# Patient Record
Sex: Male | Born: 1955 | Race: White | Hispanic: No | Marital: Married | Smoking: Former smoker
Health system: Southern US, Community
[De-identification: ages and names within clinical notes are randomized; demographics above are authoritative.]

## PROBLEM LIST (undated history)

## (undated) DIAGNOSIS — I10 Essential (primary) hypertension: Secondary | ICD-10-CM

---

## 2015-06-14 HISTORY — PX: REPAIR THORACIC AORTA: SUR1211

## 2015-07-10 ENCOUNTER — Observation Stay
Admission: AD | Admit: 2015-07-10 | Discharge: 2015-07-11 | DRG: 247 | Disposition: A | Discharge status: Home / Self Care | Payer: PRIVATE HEALTH INSURANCE | Source: Ambulatory Visit | Attending: Internal Medicine | Admitting: Internal Medicine

## 2015-07-10 ENCOUNTER — Encounter
Admission: AD | Disposition: A | Discharge status: Home / Self Care | Payer: Self-pay | Source: Ambulatory Visit | Attending: Internal Medicine

## 2015-07-10 ENCOUNTER — Encounter: Payer: Self-pay | Admitting: *Deleted

## 2015-07-10 DIAGNOSIS — E782 Mixed hyperlipidemia: Secondary | ICD-10-CM | POA: Insufficient documentation

## 2015-07-10 DIAGNOSIS — E7800 Pure hypercholesterolemia, unspecified: Secondary | ICD-10-CM | POA: Diagnosis not present

## 2015-07-10 DIAGNOSIS — I25110 Atherosclerotic heart disease of native coronary artery with unstable angina pectoris: Principal | ICD-10-CM | POA: Diagnosis present

## 2015-07-10 DIAGNOSIS — Z87891 Personal history of nicotine dependence: Secondary | ICD-10-CM

## 2015-07-10 DIAGNOSIS — I10 Essential (primary) hypertension: Secondary | ICD-10-CM | POA: Diagnosis not present

## 2015-07-10 DIAGNOSIS — I25119 Atherosclerotic heart disease of native coronary artery with unspecified angina pectoris: Secondary | ICD-10-CM | POA: Diagnosis present

## 2015-07-10 DIAGNOSIS — I200 Unstable angina: Secondary | ICD-10-CM

## 2015-07-10 HISTORY — PX: CARDIAC CATHETERIZATION: SHX172

## 2015-07-10 LAB — BASIC METABOLIC PANEL
ANION GAP: 9 (ref 5–15)
Anion gap: 7 (ref 5–15)
BUN: 13 mg/dL (ref 6–20)
BUN: 12 mg/dL (ref 6–20)
CALCIUM: 8.9 mg/dL (ref 8.9–10.3)
CHLORIDE: 104 mmol/L (ref 101–111)
CO2: 22 mmol/L (ref 22–32)
CO2: 22 mmol/L (ref 22–32)
Calcium: 8.4 mg/dL — ABNORMAL LOW (ref 8.9–10.3)
Chloride: 107 mmol/L (ref 101–111)
Creatinine, Ser: 0.96 mg/dL (ref 0.61–1.24)
Creatinine, Ser: 0.94 mg/dL (ref 0.61–1.24)
GFR calc Af Amer: >60 mL/min (ref >60)
GFR calc non Af Amer: >60 mL/min (ref >60)
GLUCOSE: 101 mg/dL — AB (ref 65–99)
Glucose, Bld: 101 mg/dL — ABNORMAL HIGH (ref 65–99)
POTASSIUM: 4.1 mmol/L (ref 3.5–5.1)
POTASSIUM: 4 mmol/L (ref 3.5–5.1)
SODIUM: 138 mmol/L (ref 135–145)
Sodium: 133 mmol/L — ABNORMAL LOW (ref 135–145)

## 2015-07-10 LAB — CBC
HCT: 37 % — ABNORMAL LOW (ref 40.0–52.0)
Hemoglobin: 12.1 g/dL — ABNORMAL LOW (ref 13.0–18.0)
MCH: 29.6 pg (ref 26.0–34.0)
MCHC: 32.6 g/dL (ref 32.0–36.0)
MCV: 90.7 fL (ref 80.0–100.0)
PLATELETS: 495 10*3/uL — AB (ref 150–440)
RBC: 4.08 MIL/uL — AB (ref 4.40–5.90)
RDW: 13 % (ref 11.5–14.5)
WBC: 11.4 10*3/uL — AB (ref 3.8–10.6)

## 2015-07-10 LAB — MRSA PCR SCREENING: MRSA by PCR: NEGATIVE

## 2015-07-10 LAB — TROPONIN I: Troponin I: 0.03 ng/mL (ref <0.031)

## 2015-07-10 SURGERY — LEFT HEART CATH AND CORONARY ANGIOGRAPHY
Anesthesia: Moderate Sedation

## 2015-07-10 MED ORDER — MIDAZOLAM HCL 2 MG/2ML IJ SOLN
INTRAMUSCULAR | Status: AC
  Filled 2015-07-10: qty 2

## 2015-07-10 MED ORDER — SODIUM CHLORIDE 0.9 % IV SOLN
INTRAVENOUS | Status: AC
  Administered 2015-07-10: 21:00:00 via INTRAVENOUS

## 2015-07-10 MED ORDER — SODIUM CHLORIDE 0.9 % IV SOLN
INTRAVENOUS | Status: DC
  Administered 2015-07-10: 16:00:00 via INTRAVENOUS

## 2015-07-10 MED ORDER — SODIUM CHLORIDE 0.9% FLUSH
3.0000 mL | Freq: Two times a day (BID) | INTRAVENOUS | Status: DC
  Administered 2015-07-10 – 2015-07-11 (×2): 3 mL via INTRAVENOUS

## 2015-07-10 MED ORDER — METOPROLOL TARTRATE 1 MG/ML IV SOLN
INTRAVENOUS | Status: DC | PRN
  Administered 2015-07-10: 5 mg via INTRAVENOUS

## 2015-07-10 MED ORDER — ONDANSETRON HCL 4 MG/2ML IJ SOLN: 4.0000 mg | Freq: Four times a day (QID) | INTRAMUSCULAR | Status: DC | PRN

## 2015-07-10 MED ORDER — ASPIRIN 81 MG PO CHEW
81.0000 mg | CHEWABLE_TABLET | Freq: Every day | ORAL | Status: DC
  Administered 2015-07-11: 81 mg via ORAL
  Filled 2015-07-10: qty 1

## 2015-07-10 MED ORDER — FENTANYL CITRATE (PF) 100 MCG/2ML IJ SOLN
INTRAMUSCULAR | Status: DC | PRN
  Administered 2015-07-10 (×2): 25 ug via INTRAVENOUS
  Administered 2015-07-10 (×2): 50 ug via INTRAVENOUS

## 2015-07-10 MED ORDER — SODIUM CHLORIDE 0.9% FLUSH: 3.0000 mL | Freq: Two times a day (BID) | INTRAVENOUS | Status: DC

## 2015-07-10 MED ORDER — ASPIRIN 81 MG PO CHEW
CHEWABLE_TABLET | ORAL | Status: AC
  Filled 2015-07-10: qty 4

## 2015-07-10 MED ORDER — ASPIRIN 81 MG PO CHEW
CHEWABLE_TABLET | ORAL | Status: DC | PRN
  Administered 2015-07-10: 324 mg via ORAL

## 2015-07-10 MED ORDER — NITROGLYCERIN 5 MG/ML IV SOLN
INTRAVENOUS | Status: AC
  Filled 2015-07-10: qty 10

## 2015-07-10 MED ORDER — METOPROLOL TARTRATE 1 MG/ML IV SOLN
INTRAVENOUS | Status: AC
  Filled 2015-07-10: qty 5

## 2015-07-10 MED ORDER — SODIUM CHLORIDE 0.9% FLUSH
3.0000 mL | INTRAVENOUS | Status: DC | PRN
  Administered 2015-07-10: 3 mL via INTRAVENOUS
  Filled 2015-07-10: qty 3

## 2015-07-10 MED ORDER — ENOXAPARIN SODIUM 40 MG/0.4ML ~~LOC~~ SOLN
40.0000 mg | SUBCUTANEOUS | Status: DC
  Filled 2015-07-10: qty 0.4

## 2015-07-10 MED ORDER — NITROGLYCERIN 0.4 MG SL SUBL
SUBLINGUAL_TABLET | SUBLINGUAL | Status: AC
  Filled 2015-07-10: qty 1

## 2015-07-10 MED ORDER — MIDAZOLAM HCL 2 MG/2ML IJ SOLN
INTRAMUSCULAR | Status: DC | PRN
  Administered 2015-07-10 (×3): 1 mg via INTRAVENOUS

## 2015-07-10 MED ORDER — TICAGRELOR 90 MG PO TABS
ORAL_TABLET | ORAL | Status: DC | PRN
  Administered 2015-07-10: 180 mg via ORAL

## 2015-07-10 MED ORDER — HEPARIN (PORCINE) IN NACL 2-0.9 UNIT/ML-% IJ SOLN
INTRAMUSCULAR | Status: AC
  Filled 2015-07-10: qty 500

## 2015-07-10 MED ORDER — IOHEXOL 300 MG/ML  SOLN
INTRAMUSCULAR | Status: DC | PRN
Start: 2015-07-10 — End: 2015-07-10
  Administered 2015-07-10: 195 mL via INTRA_ARTERIAL
  Administered 2015-07-10: 180 mL via INTRA_ARTERIAL

## 2015-07-10 MED ORDER — TICAGRELOR 90 MG PO TABS
90.0000 mg | ORAL_TABLET | Freq: Two times a day (BID) | ORAL | Status: DC
  Administered 2015-07-10 – 2015-07-11 (×2): 90 mg via ORAL
  Filled 2015-07-10 (×2): qty 1

## 2015-07-10 MED ORDER — SODIUM CHLORIDE 0.9 % IV SOLN: 250.0000 mL | INTRAVENOUS | Status: DC | PRN

## 2015-07-10 MED ORDER — SODIUM CHLORIDE 0.9 % IV SOLN
250.0000 mg | INTRAVENOUS | Status: DC | PRN
  Administered 2015-07-10: 1.75 mg/kg/h via INTRAVENOUS

## 2015-07-10 MED ORDER — BIVALIRUDIN 250 MG IV SOLR
INTRAVENOUS | Status: AC
  Filled 2015-07-10: qty 250

## 2015-07-10 MED ORDER — FENTANYL CITRATE (PF) 100 MCG/2ML IJ SOLN
INTRAMUSCULAR | Status: AC
  Filled 2015-07-10: qty 2

## 2015-07-10 MED ORDER — ATORVASTATIN CALCIUM 20 MG PO TABS: 80.0000 mg | ORAL_TABLET | Freq: Every day | ORAL | Status: DC

## 2015-07-10 MED ORDER — METOPROLOL TARTRATE 25 MG PO TABS
25.0000 mg | ORAL_TABLET | Freq: Two times a day (BID) | ORAL | Status: DC
  Administered 2015-07-10 – 2015-07-11 (×2): 25 mg via ORAL
  Filled 2015-07-10 (×2): qty 1

## 2015-07-10 MED ORDER — ACETAMINOPHEN 325 MG PO TABS
650.0000 mg | ORAL_TABLET | ORAL | Status: DC | PRN
  Administered 2015-07-10: 650 mg via ORAL
  Filled 2015-07-10: qty 2

## 2015-07-10 MED ORDER — LISINOPRIL 5 MG PO TABS
5.0000 mg | ORAL_TABLET | Freq: Every day | ORAL | Status: DC
  Administered 2015-07-10 – 2015-07-11 (×2): 5 mg via ORAL
  Filled 2015-07-10 (×2): qty 1

## 2015-07-10 MED ORDER — BIVALIRUDIN BOLUS VIA INFUSION - CUPID
INTRAVENOUS | Status: DC | PRN
  Administered 2015-07-10: 60.9 mg via INTRAVENOUS

## 2015-07-10 MED ORDER — SODIUM CHLORIDE 0.9% FLUSH: 3.0000 mL | INTRAVENOUS | Status: DC | PRN

## 2015-07-10 MED ORDER — TICAGRELOR 90 MG PO TABS
ORAL_TABLET | ORAL | Status: AC
  Filled 2015-07-10: qty 2

## 2015-07-10 MED ORDER — NITROGLYCERIN 0.4 MG SL SUBL
SUBLINGUAL_TABLET | SUBLINGUAL | Status: DC | PRN
  Administered 2015-07-10: .4 mg via SUBLINGUAL

## 2015-07-10 SURGICAL SUPPLY — 23 items
BALLN TREK RX 2.5X12 (BALLOONS) ×6
BALLN ~~LOC~~ TREK RX 3.0X12 (BALLOONS) ×3
BALLN ~~LOC~~ TREK RX 4.0X12 (BALLOONS) ×3
BALLOON TREK RX 2.5X12 (BALLOONS) ×2 IMPLANT
BALLOON ~~LOC~~ TREK RX 3.0X12 (BALLOONS) ×1 IMPLANT
BALLOON ~~LOC~~ TREK RX 4.0X12 (BALLOONS) ×1 IMPLANT
CATH INFINITI 5FR ANG PIGTAIL (CATHETERS) ×3 IMPLANT
CATH INFINITI 5FR JL4 (CATHETERS) ×3 IMPLANT
CATH INFINITI JR4 5F (CATHETERS) ×3 IMPLANT
CATH VISTA GUIDE 6FR JL3.5 (CATHETERS) ×3 IMPLANT
CATH VISTA GUIDE 6FR XB3.5 (CATHETERS) ×3 IMPLANT
DEVICE CLOSURE MYNXGRIP 6/7F (Vascular Products) ×3 IMPLANT
DEVICE INFLAT 30 PLUS (MISCELLANEOUS) ×3 IMPLANT
KIT MANI 3VAL PERCEP (MISCELLANEOUS) ×3 IMPLANT
NEEDLE PERC 18GX7CM (NEEDLE) ×3 IMPLANT
PACK CARDIAC CATH (CUSTOM PROCEDURE TRAY) ×3 IMPLANT
SHEATH PINNACLE 5F 10CM (SHEATH) ×3 IMPLANT
SHEATH PINNACLE 6F 10CM (SHEATH) ×3 IMPLANT
STENT XIENCE ALPINE RX 2.75X15 (Permanent Stent) ×3 IMPLANT
STENT XIENCE ALPINE RX 4.0X15 (Permanent Stent) ×3 IMPLANT
WIRE EMERALD 3MM-J .035X150CM (WIRE) ×6 IMPLANT
WIRE INTUITION PROPEL ST 180CM (WIRE) ×3 IMPLANT
WIRE RUNTHROUGH .014X180CM (WIRE) ×3 IMPLANT

## 2015-07-10 NOTE — Progress Notes (Addendum)
eLink Physician-Brief Progress Note Patient Name: Christian Brennan DOB: 12-Feb-1956 MRN: [id number]   Date of Service  07/10/2015  HPI/Events of Note  60 yo male. No H&P or Cardiac Cath note at this time. Hx from bedside nurse. Admitted for chest pain >> cath lab >> stents to LAD and Circ. Current medical regimen includes ASA, Lipitor, Lovenox Barney, Lisinopril, Lopressor and Brilinta. Management per Cardiology.   eICU Interventions  Continue present management.      Intervention Category Major Interventions: Other: Evaluation Type: New Patient Evaluation  Lenell Antu 07/10/2015, 8:45 PM

## 2015-07-10 NOTE — Progress Notes (Signed)
Report to [facility] by [facility] lab.  Check right groin for bleeding or hematoma.  Patient will be on bedrest for 2 hours post sheath pull---out of bed at 19:30.  Bilateral pulses are 2's PT's.

## 2015-07-11 ENCOUNTER — Encounter: Payer: Self-pay | Admitting: *Deleted

## 2015-07-11 DIAGNOSIS — I25110 Atherosclerotic heart disease of native coronary artery with unstable angina pectoris: Secondary | ICD-10-CM | POA: Diagnosis not present

## 2015-07-11 LAB — CBC
HEMATOCRIT: 33.4 % — AB (ref 40.0–52.0)
HEMOGLOBIN: 11.3 g/dL — AB (ref 13.0–18.0)
MCH: 30 pg (ref 26.0–34.0)
MCHC: 33.9 g/dL (ref 32.0–36.0)
MCV: 88.4 fL (ref 80.0–100.0)
Platelets: 449 10*3/uL — ABNORMAL HIGH (ref 150–440)
RBC: 3.78 MIL/uL — ABNORMAL LOW (ref 4.40–5.90)
RDW: 13 % (ref 11.5–14.5)
WBC: 12.2 10*3/uL — ABNORMAL HIGH (ref 3.8–10.6)

## 2015-07-11 LAB — BASIC METABOLIC PANEL
ANION GAP: 13 (ref 5–15)
BUN: 14 mg/dL (ref 6–20)
CALCIUM: 8.7 mg/dL — AB (ref 8.9–10.3)
CHLORIDE: 106 mmol/L (ref 101–111)
CO2: 19 mmol/L — AB (ref 22–32)
Creatinine, Ser: 0.81 mg/dL (ref 0.61–1.24)
GFR calc Af Amer: >60 mL/min (ref >60)
GFR calc non Af Amer: >60 mL/min (ref >60)
GLUCOSE: 105 mg/dL — AB (ref 65–99)
POTASSIUM: 3.6 mmol/L (ref 3.5–5.1)
Sodium: 138 mmol/L (ref 135–145)

## 2015-07-11 MED ORDER — METOPROLOL TARTRATE 25 MG PO TABS
25.0000 mg | ORAL_TABLET | Freq: Once | ORAL | Status: AC
  Administered 2015-07-11: 25 mg via ORAL
  Filled 2015-07-11: qty 1

## 2015-07-11 MED ORDER — LISINOPRIL 5 MG PO TABS: 5.0000 mg | ORAL_TABLET | Freq: Every day | ORAL | Status: DC

## 2015-07-11 MED ORDER — ATORVASTATIN CALCIUM 80 MG PO TABS: 80.0000 mg | ORAL_TABLET | Freq: Every day | ORAL | Status: AC

## 2015-07-11 MED ORDER — TICAGRELOR 90 MG PO TABS: 90.0000 mg | ORAL_TABLET | Freq: Two times a day (BID) | ORAL | Status: DC

## 2015-07-11 MED ORDER — ASPIRIN 81 MG PO CHEW: 81.0000 mg | CHEWABLE_TABLET | Freq: Every day | ORAL | Status: DC

## 2015-07-11 MED ORDER — METOPROLOL TARTRATE 50 MG PO TABS: 50.0000 mg | ORAL_TABLET | Freq: Two times a day (BID) | ORAL | Status: DC

## 2015-07-11 NOTE — Progress Notes (Signed)
Ambulated with patient in the hallway. He was able to complete one lap around the entire ICU until he asked to lay back down. Patient denied SOB, dizziness, or chest pain. According to monitor, the patient has begun going in and out of a-fib since 0840 this morning. Paged Dr. Gwen Pounds and order to obtain EKG was given. Order placed in epic.

## 2015-07-11 NOTE — Progress Notes (Signed)
Patient is to be discharged today. Patient is in no acute distress at this time, and assessment is unchanged from this morning. Patient's IV is out, discharge paperwork has been discussed with patient/family and there are no questions or concerns at this time. Patient will be accompanied downstairs by staff and family via wheelchair.   

## 2015-07-11 NOTE — Discharge Summary (Signed)
[facility] Cardiology Discharge Summary  Patient ID: Christian Brennan MRN: [id number] DOB/AGE: 02-23-1956 60 y.o.  Admit date: 07/10/2015 Discharge date: 07/11/2015  Primary Discharge Diagnosis: Coronary artery disease with angina I25.119 Secondary Discharge Diagnosis high blood pressure, high cholesterol and smoking  Significant Diagnostic Studies: Cardiac cath with left ventricular angiogram and selective coronary injection as well as PCI and stent placement of left anterior descending. And left circumflex artery  Hospital Course: The patient was admitted to specials for cardiac cath with selective coronary angiogram after full consent, risk and benefits explained, and time out called with all approprate details voiced and discussed. The patient has had progressive canadian class 4 angina with ischemic chest pain and or anginal equivalent with coronary artery risk factors including high blood pressure, high cholesterol and smoking. The procedure was performed without complication and it revealed abnormal left ventricular function with ejection fraction of 35%.  It was found that the patient had severe 2 vessel coronary atherosclerosis with significant left anterior descending artery and left circumflex artery stenosis requiring further intervention. Therefore, the patient had a PCI and drug eluding stent placed without complication. The patient has been ambulating without further significant symptoms and has reached his maximal hospital benefit and will be discharged to home in good condition.  Cardiac rehabilitation has been discussed and recommended. Medication management of cardiovascular risk factors will be given post discharge and modified as an outpatient.  The patient had minimal sinus arrhythmia with ambulation but no evidence of symptoms walking around the unit and adjustments of medications were made listed below Discharge Exam: Blood pressure 126/90, pulse 97, temperature 98.7  F (37.1 C), temperature source Oral, resp. rate 18, height  (1.6 m), weight 183 lb 6.8 oz (83.2 kg), SpO2 97 %.  Constitutional: Alet oriented to person, place, and time. No distress.  HENT: No nasal discharge.  Head: Normocephalic and atraumatic.  Eyes: Pupils are equal and round. No discharge.  Neck: Normal range of motion. Neck supple. No JVD present. No thyromegaly present.  Cardiovascular: Normal rate, regular rhythm, normal S1 S2, no gallop, no friction rub. No murmur Pulmonary/Chest: Effort normal, No stridor. No respiratory distress. no wheezes.  no rales.    Abdominal: Soft. Bowel sounds are normal.  no distension.  no tenderness. There is no rebound and no guarding.  Musculoskeletal: No edema, no cyanosis, normal pulses, no bleeding, Normal range of motion. no tenderness.  Neurological:  alert and oriented to person, place, and time. Coordination normal.  Skin: Skin is warm and dry. No rash noted. No erythema. No pallor.  Psychiatric:  normal mood and affect. behavior is normal.    Labs:   Lab Results  Component Value Date   WBC 12.2* 07/11/2015   HGB 11.3* 07/11/2015   HCT 33.4* 07/11/2015   MCV 88.4 07/11/2015   PLT 449* 07/11/2015    Recent Labs Lab 07/11/15 0438  NA 138  K 3.6  CL 106  CO2 19*  BUN 14  CREATININE 0.81  CALCIUM 8.7*  GLUCOSE 105*    EKG: NSR without evidence of new changes  FOLLOW UP IN ONE TO TWO WEEKS Discharge Instructions    AMB Referral to Cardiac Rehabilitation - Phase II    Complete by:  As directed   Diagnosis:  PCI            Medication List    STOP taking these medications        ibuprofen 200 MG tablet  Commonly known  as:  ADVIL,MOTRIN      TAKE these medications        aspirin 81 MG chewable tablet  Chew 1 tablet (81 mg total) by mouth daily.     atorvastatin 80 MG tablet  Commonly known as:  LIPITOR  Take 1 tablet (80 mg total) by mouth daily at 6 PM.     famotidine 20 MG tablet  Commonly known as:   PEPCID  Take 20 mg by mouth 2 (two) times daily. Reported on 07/11/2015     lisinopril 5 MG tablet  Commonly known as:  PRINIVIL,ZESTRIL  Take 1 tablet (5 mg total) by mouth daily.     metoprolol 50 MG tablet  Commonly known as:  LOPRESSOR  Take 1 tablet (50 mg total) by mouth 2 (two) times daily.     ticagrelor 90 MG Tabs tablet  Commonly known as:  BRILINTA  Take 1 tablet (90 mg total) by mouth 2 (two) times daily.           Follow-up Information    Follow up with Lamar Blinks, MD. Schedule an appointment as soon as possible for a visit in 7 days.   Specialty:  Internal Medicine   Why:  cath follow up   Contact information:   [address] [facility] [place] [address] [phone]       THE PATIENT  SHALL BRING ALL MEDICATIONS TO FOLLOW UP APPOINTMENT  Signed:  Lamar Blinks MD, [facility] 07/11/2015, 10:07 AM

## 2015-07-13 ENCOUNTER — Encounter: Payer: Self-pay | Admitting: Internal Medicine

## 2015-07-17 ENCOUNTER — Other Ambulatory Visit: Payer: Self-pay | Admitting: Internal Medicine

## 2015-07-17 DIAGNOSIS — K8689 Other specified diseases of pancreas: Secondary | ICD-10-CM

## 2015-07-17 DIAGNOSIS — I2510 Atherosclerotic heart disease of native coronary artery without angina pectoris: Secondary | ICD-10-CM | POA: Insufficient documentation

## 2015-07-20 ENCOUNTER — Ambulatory Visit
Admission: RE | Admit: 2015-07-20 | Discharge: 2015-07-20 | Disposition: A | Discharge status: Home / Self Care | Payer: PRIVATE HEALTH INSURANCE | Source: Ambulatory Visit | Attending: Internal Medicine | Admitting: Internal Medicine

## 2015-07-20 DIAGNOSIS — R1084 Generalized abdominal pain: Secondary | ICD-10-CM | POA: Insufficient documentation

## 2015-07-20 DIAGNOSIS — K8689 Other specified diseases of pancreas: Secondary | ICD-10-CM

## 2015-08-05 ENCOUNTER — Other Ambulatory Visit: Payer: Self-pay | Admitting: Nurse Practitioner

## 2015-08-05 DIAGNOSIS — R1013 Epigastric pain: Secondary | ICD-10-CM

## 2015-08-05 DIAGNOSIS — R142 Eructation: Secondary | ICD-10-CM

## 2015-08-05 DIAGNOSIS — R1084 Generalized abdominal pain: Secondary | ICD-10-CM

## 2015-08-11 ENCOUNTER — Ambulatory Visit
Admission: RE | Admit: 2015-08-11 | Discharge: 2015-08-11 | Disposition: A | Discharge status: Home / Self Care | Payer: No Typology Code available for payment source | Source: Ambulatory Visit | Attending: Nurse Practitioner | Admitting: Nurse Practitioner

## 2015-08-11 ENCOUNTER — Other Ambulatory Visit: Payer: Self-pay | Admitting: Nurse Practitioner

## 2015-08-11 ENCOUNTER — Emergency Department: Payer: No Typology Code available for payment source | Admitting: Anesthesiology

## 2015-08-11 ENCOUNTER — Encounter: Payer: Self-pay | Admitting: Anesthesiology

## 2015-08-11 ENCOUNTER — Inpatient Hospital Stay
Admission: EM | Admit: 2015-08-11 | Discharge: 2015-08-14 | DRG: 219 | Disposition: A | Discharge status: Home / Self Care | Payer: No Typology Code available for payment source | Attending: Vascular Surgery | Admitting: Vascular Surgery

## 2015-08-11 ENCOUNTER — Encounter
Admission: EM | Disposition: A | Discharge status: Home / Self Care | Payer: Self-pay | Source: Home / Self Care | Attending: Vascular Surgery

## 2015-08-11 DIAGNOSIS — I493 Ventricular premature depolarization: Secondary | ICD-10-CM | POA: Diagnosis present

## 2015-08-11 DIAGNOSIS — T82897A Other specified complication of cardiac prosthetic devices, implants and grafts, initial encounter: Secondary | ICD-10-CM | POA: Diagnosis present

## 2015-08-11 DIAGNOSIS — I711 Thoracic aortic aneurysm, ruptured, unspecified: Secondary | ICD-10-CM | POA: Diagnosis present

## 2015-08-11 DIAGNOSIS — I2510 Atherosclerotic heart disease of native coronary artery without angina pectoris: Secondary | ICD-10-CM | POA: Diagnosis present

## 2015-08-11 DIAGNOSIS — I724 Aneurysm of artery of lower extremity: Secondary | ICD-10-CM | POA: Diagnosis present

## 2015-08-11 DIAGNOSIS — R1084 Generalized abdominal pain: Secondary | ICD-10-CM

## 2015-08-11 DIAGNOSIS — Z7982 Long term (current) use of aspirin: Secondary | ICD-10-CM | POA: Diagnosis not present

## 2015-08-11 DIAGNOSIS — R9389 Abnormal findings on diagnostic imaging of other specified body structures: Secondary | ICD-10-CM

## 2015-08-11 DIAGNOSIS — Y838 Other surgical procedures as the cause of abnormal reaction of the patient, or of later complication, without mention of misadventure at the time of the procedure: Secondary | ICD-10-CM | POA: Diagnosis present

## 2015-08-11 DIAGNOSIS — E785 Hyperlipidemia, unspecified: Secondary | ICD-10-CM | POA: Diagnosis present

## 2015-08-11 DIAGNOSIS — R1013 Epigastric pain: Secondary | ICD-10-CM

## 2015-08-11 DIAGNOSIS — R142 Eructation: Secondary | ICD-10-CM

## 2015-08-11 DIAGNOSIS — I10 Essential (primary) hypertension: Secondary | ICD-10-CM | POA: Diagnosis present

## 2015-08-11 DIAGNOSIS — K219 Gastro-esophageal reflux disease without esophagitis: Secondary | ICD-10-CM | POA: Diagnosis present

## 2015-08-11 DIAGNOSIS — Z79899 Other long term (current) drug therapy: Secondary | ICD-10-CM | POA: Diagnosis not present

## 2015-08-11 DIAGNOSIS — Z87891 Personal history of nicotine dependence: Secondary | ICD-10-CM

## 2015-08-11 DIAGNOSIS — I4891 Unspecified atrial fibrillation: Secondary | ICD-10-CM | POA: Diagnosis present

## 2015-08-11 DIAGNOSIS — I209 Angina pectoris, unspecified: Secondary | ICD-10-CM

## 2015-08-11 DIAGNOSIS — E43 Unspecified severe protein-calorie malnutrition: Secondary | ICD-10-CM | POA: Insufficient documentation

## 2015-08-11 HISTORY — DX: Essential (primary) hypertension: I10

## 2015-08-11 LAB — CBC
HCT: 29.5 % — ABNORMAL LOW (ref 40.0–52.0)
Hemoglobin: 9.3 g/dL — ABNORMAL LOW (ref 13.0–18.0)
MCH: 27.9 pg (ref 26.0–34.0)
MCHC: 31.7 g/dL — ABNORMAL LOW (ref 32.0–36.0)
MCV: 88 fL (ref 80.0–100.0)
PLATELETS: 495 10*3/uL — AB (ref 150–440)
RBC: 3.35 MIL/uL — AB (ref 4.40–5.90)
RDW: 14.1 % (ref 11.5–14.5)
WBC: 12.8 10*3/uL — AB (ref 3.8–10.6)

## 2015-08-11 LAB — CBC WITH DIFFERENTIAL/PLATELET
BASOS ABS: 0 10*3/uL (ref 0–0.1)
BASOS PCT: 0 %
EOS PCT: 1 %
Eosinophils Absolute: 0.1 10*3/uL (ref 0–0.7)
HCT: 33.2 % — ABNORMAL LOW (ref 40.0–52.0)
Hemoglobin: 10.9 g/dL — ABNORMAL LOW (ref 13.0–18.0)
LYMPHS PCT: 9 %
Lymphs Abs: 1.1 10*3/uL (ref 1.0–3.6)
MCH: 28.8 pg (ref 26.0–34.0)
MCHC: 32.8 g/dL (ref 32.0–36.0)
MCV: 87.7 fL (ref 80.0–100.0)
MONO ABS: 1.4 10*3/uL — AB (ref 0.2–1.0)
MONOS PCT: 11 %
Neutro Abs: 10.2 10*3/uL — ABNORMAL HIGH (ref 1.4–6.5)
Neutrophils Relative %: 79 %
PLATELETS: 597 10*3/uL — AB (ref 150–440)
RBC: 3.78 MIL/uL — ABNORMAL LOW (ref 4.40–5.90)
RDW: 14.3 % (ref 11.5–14.5)
WBC: 12.9 10*3/uL — ABNORMAL HIGH (ref 3.8–10.6)

## 2015-08-11 LAB — COMPREHENSIVE METABOLIC PANEL
ALK PHOS: 354 U/L — AB (ref 38–126)
ALT: 40 U/L (ref 17–63)
AST: 28 U/L (ref 15–41)
Albumin: 2.9 g/dL — ABNORMAL LOW (ref 3.5–5.0)
Anion gap: 9 (ref 5–15)
BUN: 11 mg/dL (ref 6–20)
CALCIUM: 9 mg/dL (ref 8.9–10.3)
CO2: 24 mmol/L (ref 22–32)
CREATININE: 0.93 mg/dL (ref 0.61–1.24)
Chloride: 102 mmol/L (ref 101–111)
Glucose, Bld: 110 mg/dL — ABNORMAL HIGH (ref 65–99)
Potassium: 3 mmol/L — ABNORMAL LOW (ref 3.5–5.1)
Sodium: 135 mmol/L (ref 135–145)
Total Bilirubin: 0.6 mg/dL (ref 0.3–1.2)
Total Protein: 8.2 g/dL — ABNORMAL HIGH (ref 6.5–8.1)

## 2015-08-11 LAB — POTASSIUM: POTASSIUM: 3.6 mmol/L (ref 3.5–5.1)

## 2015-08-11 LAB — PROTIME-INR
INR: 1.17
Prothrombin Time: 15.1 seconds — ABNORMAL HIGH (ref 11.4–15.0)

## 2015-08-11 LAB — TROPONIN I
TROPONIN I: 0.05 ng/mL — AB (ref <0.031)
TROPONIN I: 0.03 ng/mL (ref <0.031)

## 2015-08-11 LAB — MRSA PCR SCREENING: MRSA BY PCR: NEGATIVE

## 2015-08-11 LAB — LIPASE, BLOOD: LIPASE: 28 U/L (ref 11–51)

## 2015-08-11 LAB — APTT: APTT: 41 s — AB (ref 24–36)

## 2015-08-11 LAB — MAGNESIUM: MAGNESIUM: 1.7 mg/dL (ref 1.7–2.4)

## 2015-08-11 LAB — ABO/RH: ABO/RH(D): A POS

## 2015-08-11 SURGERY — ENDOVASCULAR AORTIC REPAIR
Anesthesia: General | Wound class: Clean

## 2015-08-11 SURGERY — Surgical Case
Anesthesia: *Unknown

## 2015-08-11 MED ORDER — MIDAZOLAM HCL 2 MG/2ML IJ SOLN
INTRAMUSCULAR | Status: DC | PRN
  Administered 2015-08-11: 2 mg via INTRAVENOUS

## 2015-08-11 MED ORDER — DOPAMINE-DEXTROSE 3.2-5 MG/ML-% IV SOLN: 3.0000 ug/kg/min | INTRAVENOUS | Status: DC

## 2015-08-11 MED ORDER — MORPHINE SULFATE (PF) 2 MG/ML IV SOLN
2.0000 mg | INTRAVENOUS | Status: DC | PRN
  Administered 2015-08-11: 2 mg via INTRAVENOUS
  Administered 2015-08-12: 4 mg via INTRAVENOUS
  Administered 2015-08-12 (×2): 2 mg via INTRAVENOUS
  Filled 2015-08-11 (×3): qty 1
  Filled 2015-08-11: qty 2

## 2015-08-11 MED ORDER — LABETALOL HCL 5 MG/ML IV SOLN
10.0000 mg | INTRAVENOUS | Status: DC | PRN
Start: 2015-08-11 — End: 2015-08-14
  Filled 2015-08-11: qty 4

## 2015-08-11 MED ORDER — PANTOPRAZOLE SODIUM 40 MG PO TBEC
40.0000 mg | DELAYED_RELEASE_TABLET | Freq: Two times a day (BID) | ORAL | Status: DC
  Administered 2015-08-12: 40 mg via ORAL
  Filled 2015-08-11: qty 1

## 2015-08-11 MED ORDER — SODIUM CHLORIDE 0.9 % IV SOLN
INTRAVENOUS | Status: DC | PRN
  Administered 2015-08-11 (×2): via INTRAVENOUS

## 2015-08-11 MED ORDER — LISINOPRIL 5 MG PO TABS
5.0000 mg | ORAL_TABLET | Freq: Every day | ORAL | Status: DC
  Administered 2015-08-12 – 2015-08-14 (×3): 5 mg via ORAL
  Filled 2015-08-11: qty 1
  Filled 2015-08-11: qty 2
  Filled 2015-08-11: qty 1

## 2015-08-11 MED ORDER — HEPARIN SODIUM (PORCINE) 1000 UNIT/ML IJ SOLN
INTRAMUSCULAR | Status: DC | PRN
  Administered 2015-08-11: 6000 [IU] via INTRAVENOUS

## 2015-08-11 MED ORDER — ASPIRIN EC 81 MG PO TBEC
81.0000 mg | DELAYED_RELEASE_TABLET | Freq: Every day | ORAL | Status: DC
  Administered 2015-08-12 – 2015-08-14 (×3): 81 mg via ORAL
  Filled 2015-08-11 (×3): qty 1

## 2015-08-11 MED ORDER — ACETAMINOPHEN 325 MG PO TABS: 325.0000 mg | ORAL_TABLET | ORAL | Status: DC | PRN

## 2015-08-11 MED ORDER — ENOXAPARIN SODIUM 40 MG/0.4ML ~~LOC~~ SOLN
40.0000 mg | SUBCUTANEOUS | Status: DC
  Filled 2015-08-11 (×3): qty 0.4

## 2015-08-11 MED ORDER — ACETAMINOPHEN 325 MG RE SUPP: 325.0000 mg | RECTAL | Status: DC | PRN

## 2015-08-11 MED ORDER — POLYETHYLENE GLYCOL 3350 17 G PO PACK: 17.0000 g | PACK | Freq: Every day | ORAL | Status: DC | PRN

## 2015-08-11 MED ORDER — SUCCINYLCHOLINE CHLORIDE 20 MG/ML IJ SOLN
INTRAMUSCULAR | Status: DC | PRN
  Administered 2015-08-11: 100 mg via INTRAVENOUS

## 2015-08-11 MED ORDER — GLYCOPYRROLATE 0.2 MG/ML IJ SOLN
INTRAMUSCULAR | Status: DC | PRN
  Administered 2015-08-11: 0.4 mg via INTRAVENOUS
  Administered 2015-08-11: 0.2 mg via INTRAVENOUS

## 2015-08-11 MED ORDER — SORBITOL 70 % SOLN
30.0000 mL | Freq: Every day | Status: DC | PRN
  Filled 2015-08-11: qty 30

## 2015-08-11 MED ORDER — SODIUM CHLORIDE 0.9 % IV SOLN
INTRAVENOUS | Status: DC
  Administered 2015-08-11 – 2015-08-12 (×2): via INTRAVENOUS

## 2015-08-11 MED ORDER — PROPOFOL 10 MG/ML IV BOLUS
INTRAVENOUS | Status: DC | PRN
  Administered 2015-08-11: 20 mg via INTRAVENOUS
  Administered 2015-08-11: 30 mg via INTRAVENOUS
  Administered 2015-08-11: 150 mg via INTRAVENOUS

## 2015-08-11 MED ORDER — DEXMEDETOMIDINE HCL 200 MCG/2ML IV SOLN
INTRAVENOUS | Status: DC | PRN
  Administered 2015-08-11 (×2): 4 ug via INTRAVENOUS
  Administered 2015-08-11: 8 ug via INTRAVENOUS

## 2015-08-11 MED ORDER — ROCURONIUM BROMIDE 100 MG/10ML IV SOLN
INTRAVENOUS | Status: DC | PRN
  Administered 2015-08-11: 10 mg via INTRAVENOUS
  Administered 2015-08-11: 30 mg via INTRAVENOUS
  Administered 2015-08-11: 20 mg via INTRAVENOUS

## 2015-08-11 MED ORDER — FLEET ENEMA 7-19 GM/118ML RE ENEM: 1.0000 | ENEMA | Freq: Once | RECTAL | Status: DC | PRN

## 2015-08-11 MED ORDER — SUCRALFATE 1 G PO TABS
1.0000 g | ORAL_TABLET | Freq: Three times a day (TID) | ORAL | Status: DC
  Administered 2015-08-11 – 2015-08-14 (×10): 1 g via ORAL
  Filled 2015-08-11 (×11): qty 1

## 2015-08-11 MED ORDER — SODIUM CHLORIDE 0.9 % IV SOLN: 500.0000 mL | Freq: Once | INTRAVENOUS | Status: DC | PRN

## 2015-08-11 MED ORDER — ATORVASTATIN CALCIUM 20 MG PO TABS
80.0000 mg | ORAL_TABLET | Freq: Every day | ORAL | Status: DC
  Administered 2015-08-12 – 2015-08-13 (×2): 80 mg via ORAL
  Filled 2015-08-11: qty 4
  Filled 2015-08-11: qty 1
  Filled 2015-08-11: qty 4

## 2015-08-11 MED ORDER — SUGAMMADEX SODIUM 200 MG/2ML IV SOLN
INTRAVENOUS | Status: DC | PRN
  Administered 2015-08-11: 200 mg via INTRAVENOUS

## 2015-08-11 MED ORDER — NITROGLYCERIN IN D5W 200-5 MCG/ML-% IV SOLN: 5.0000 ug/min | INTRAVENOUS | Status: DC

## 2015-08-11 MED ORDER — SODIUM CHLORIDE 0.9 % IV BOLUS (SEPSIS)
1000.0000 mL | Freq: Once | INTRAVENOUS | Status: AC
  Administered 2015-08-11: 1000 mL via INTRAVENOUS

## 2015-08-11 MED ORDER — ALUM & MAG HYDROXIDE-SIMETH 200-200-20 MG/5ML PO SUSP: 15.0000 mL | ORAL | Status: DC | PRN

## 2015-08-11 MED ORDER — MAGNESIUM SULFATE 2 GM/50ML IV SOLN
2.0000 g | Freq: Every day | INTRAVENOUS | Status: DC | PRN
  Filled 2015-08-11: qty 50

## 2015-08-11 MED ORDER — POTASSIUM CHLORIDE CRYS ER 20 MEQ PO TBCR: 20.0000 meq | EXTENDED_RELEASE_TABLET | Freq: Every day | ORAL | Status: DC | PRN

## 2015-08-11 MED ORDER — ONDANSETRON HCL 4 MG/2ML IJ SOLN
4.0000 mg | Freq: Four times a day (QID) | INTRAMUSCULAR | Status: DC | PRN
  Administered 2015-08-12 – 2015-08-13 (×2): 4 mg via INTRAVENOUS
  Filled 2015-08-11 (×2): qty 2

## 2015-08-11 MED ORDER — CEFAZOLIN SODIUM 1 G IJ SOLR
INTRAMUSCULAR | Status: DC | PRN
  Administered 2015-08-11: 1 g via INTRAMUSCULAR

## 2015-08-11 MED ORDER — TICAGRELOR 90 MG PO TABS
90.0000 mg | ORAL_TABLET | Freq: Two times a day (BID) | ORAL | Status: DC
  Administered 2015-08-11 – 2015-08-14 (×6): 90 mg via ORAL
  Filled 2015-08-11 (×8): qty 1

## 2015-08-11 MED ORDER — PHENYLEPHRINE HCL 10 MG/ML IJ SOLN
INTRAMUSCULAR | Status: DC | PRN
  Administered 2015-08-11 (×3): 80 ug via INTRAVENOUS

## 2015-08-11 MED ORDER — IOHEXOL 350 MG/ML SOLN
100.0000 mL | Freq: Once | INTRAVENOUS | Status: AC | PRN
  Administered 2015-08-11: 100 mL via INTRAVENOUS

## 2015-08-11 MED ORDER — NITROGLYCERIN 0.4 MG SL SUBL
0.4000 mg | SUBLINGUAL_TABLET | SUBLINGUAL | Status: DC | PRN
  Administered 2015-08-11 (×2): 0.4 mg via SUBLINGUAL
  Filled 2015-08-11 (×2): qty 1

## 2015-08-11 MED ORDER — OXYCODONE-ACETAMINOPHEN 5-325 MG PO TABS
1.0000 | ORAL_TABLET | ORAL | Status: DC | PRN
  Administered 2015-08-12 (×3): 1 via ORAL
  Administered 2015-08-13 – 2015-08-14 (×4): 2 via ORAL
  Filled 2015-08-11: qty 1
  Filled 2015-08-11 (×4): qty 2
  Filled 2015-08-11: qty 1
  Filled 2015-08-11: qty 2

## 2015-08-11 MED ORDER — METOPROLOL TARTRATE 50 MG PO TABS
50.0000 mg | ORAL_TABLET | Freq: Two times a day (BID) | ORAL | Status: DC
  Administered 2015-08-11 – 2015-08-14 (×5): 50 mg via ORAL
  Filled 2015-08-11 (×5): qty 1

## 2015-08-11 MED ORDER — FAMOTIDINE IN NACL 20-0.9 MG/50ML-% IV SOLN
20.0000 mg | Freq: Two times a day (BID) | INTRAVENOUS | Status: DC
  Administered 2015-08-11 – 2015-08-13 (×4): 20 mg via INTRAVENOUS
  Filled 2015-08-11 (×5): qty 50

## 2015-08-11 MED ORDER — GUAIFENESIN-DM 100-10 MG/5ML PO SYRP: 15.0000 mL | ORAL_SOLUTION | ORAL | Status: DC | PRN

## 2015-08-11 MED ORDER — METOPROLOL TARTRATE 1 MG/ML IV SOLN: 2.0000 mg | INTRAVENOUS | Status: DC | PRN

## 2015-08-11 MED ORDER — LIDOCAINE HCL (CARDIAC) 20 MG/ML IV SOLN
INTRAVENOUS | Status: DC | PRN
  Administered 2015-08-11: 50 mg via INTRAVENOUS

## 2015-08-11 MED ORDER — FENTANYL CITRATE (PF) 100 MCG/2ML IJ SOLN
INTRAMUSCULAR | Status: DC | PRN
  Administered 2015-08-11: 25 ug via INTRAVENOUS
  Administered 2015-08-11: 100 ug via INTRAVENOUS
  Administered 2015-08-11: 25 ug via INTRAVENOUS
  Administered 2015-08-11: 50 ug via INTRAVENOUS

## 2015-08-11 MED ORDER — PHENOL 1.4 % MT LIQD
1.0000 | OROMUCOSAL | Status: DC | PRN
  Filled 2015-08-11: qty 177

## 2015-08-11 MED ORDER — DEXTROSE 5 % IV SOLN
1.5000 g | Freq: Two times a day (BID) | INTRAVENOUS | Status: AC
  Administered 2015-08-11 – 2015-08-12 (×2): 1.5 g via INTRAVENOUS
  Filled 2015-08-11 (×2): qty 1.5

## 2015-08-11 MED ORDER — MORPHINE SULFATE (PF) 2 MG/ML IV SOLN
INTRAVENOUS | Status: AC
  Administered 2015-08-11: 2 mg via INTRAMUSCULAR
  Filled 2015-08-11: qty 1

## 2015-08-11 MED ORDER — DOCUSATE SODIUM 100 MG PO CAPS
100.0000 mg | ORAL_CAPSULE | Freq: Every day | ORAL | Status: DC
  Administered 2015-08-12 – 2015-08-14 (×3): 100 mg via ORAL
  Filled 2015-08-11 (×3): qty 1

## 2015-08-11 MED ORDER — NEOSTIGMINE METHYLSULFATE 10 MG/10ML IV SOLN
INTRAVENOUS | Status: DC | PRN
  Administered 2015-08-11: 3 mg via INTRAVENOUS
  Administered 2015-08-11: 1 mg via INTRAVENOUS

## 2015-08-11 MED ORDER — IOHEXOL 300 MG/ML  SOLN
100.0000 mL | Freq: Once | INTRAMUSCULAR | Status: AC | PRN
  Administered 2015-08-11: 100 mL via INTRAVENOUS

## 2015-08-11 MED ORDER — IOHEXOL 300 MG/ML  SOLN
INTRAMUSCULAR | Status: DC | PRN
  Administered 2015-08-11: 60 mL via INTRA_ARTERIAL

## 2015-08-11 MED ORDER — SODIUM CHLORIDE 0.9 % IV SOLN
INTRAVENOUS | Status: DC | PRN
  Administered 2015-08-11 (×2): via INTRAVENOUS

## 2015-08-11 MED ORDER — HYDRALAZINE HCL 20 MG/ML IJ SOLN: 5.0000 mg | INTRAMUSCULAR | Status: DC | PRN

## 2015-08-11 SURGICAL SUPPLY — 31 items
CATH BALLN TRILOBE 26-42 (BALLOONS) ×3 IMPLANT
CATH KA2 5FR 65CM (CATHETERS) ×3 IMPLANT
CATH SIZING 5F 100CM .035 PIG (CATHETERS) ×6 IMPLANT
DEVICE STARCLOSE SE CLOSURE (Vascular Products) ×3 IMPLANT
DEVICE TORQUE (MISCELLANEOUS) ×3 IMPLANT
DRYSEAL FLEXSHEATH 24FR 33CM (SHEATH) ×2
ENDOPROSTHESIS THORAC 31X31X15 (Endovascular Graft) ×1 IMPLANT
ENDOPROSTHESIS THORAC 37X37X20 (Endovascular Graft) ×1 IMPLANT
ENDOPROTH THORACIC 31X31X15 (Endovascular Graft) ×3 IMPLANT
ENDOPROTH THORACIC 37X37X20 (Endovascular Graft) ×3 IMPLANT
HEMOSTAT SURGICEL 2X3 (HEMOSTASIS) ×3 IMPLANT
NEEDLE ENTRY 21GA 7CM ECHOTIP (NEEDLE) ×3 IMPLANT
PACK ANGIOGRAPHY (CUSTOM PROCEDURE TRAY) ×3 IMPLANT
SET INTRO CAPELLA COAXIAL (SET/KITS/TRAYS/PACK) ×3 IMPLANT
SHEATH BRITE TIP 5FRX11 (SHEATH) ×3 IMPLANT
SHEATH BRITE TIP 6FRX11 (SHEATH) ×3 IMPLANT
SHEATH BRITE TIP 8FRX11 (SHEATH) ×3 IMPLANT
SHEATH DRYSEAL FLEX 24FR 33CM (SHEATH) ×1 IMPLANT
SPONGE XRAY 4X4 16PLY STRL (MISCELLANEOUS) ×12 IMPLANT
SUT PROLENE 5 0 RB 1 DA (SUTURE) ×12 IMPLANT
SUT PROLENE 5-0 (SUTURE) ×4
SUT PROLENE 5-0 BB 36X2 ARM (SUTURE) ×2
SUT SILK 2 0 (SUTURE) ×2
SUT SILK 2-0 18XBRD TIE 12 (SUTURE) ×1 IMPLANT
SUTURE PROLEN 5-0 BB 36X2 ARM (SUTURE) ×2 IMPLANT
SYR MEDRAD MARK V 150ML (SYRINGE) ×3 IMPLANT
TOWEL OR 17X26 4PK STRL BLUE (TOWEL DISPOSABLE) ×6 IMPLANT
TUBING CONTRAST HIGH PRESS 72 (TUBING) ×3 IMPLANT
WIRE G LUND 35X260X7 (WIRE) ×3 IMPLANT
WIRE J 3MM .035X145CM (WIRE) ×6 IMPLANT
WIRE MAGIC TORQUE 260C (WIRE) ×3 IMPLANT

## 2015-08-11 NOTE — ED Provider Notes (Signed)
[facility] Emergency Department Provider Note  ____________________________________________  Time seen: 5:00 PM  I have reviewed the triage vital signs and the nursing notes.   HISTORY  Chief Complaint Abdominal Pain  Level 5 caveat:  Portions of the history and physical were unable to be obtained due to the patient's acute illness, not in patient's best interest to obtain a thorough history.   HPI Christian Brennan is a 60 y.o. male who complains of epigastric abdominal pain and mid back pain moderate intensity, constant, throbbing and sharp. He reports this is been going on for the past 2 months but has worsened recently.  He had a heart catheterization with stent deployment 4 weeks ago by Dr. Gwen Pounds.He does have residual pain in the right femoral area since then.     Past Medical History  Diagnosis Date  . Hypertension      Patient Active Problem List   Diagnosis Date Noted  . Unstable angina (HCC) 07/10/2015     Past Surgical History  Procedure Laterality Date  . Cardiac catheterization N/A 07/10/2015    Procedure: Left Heart Cath and Coronary Angiography;  Surgeon: Lamar Blinks, MD;  Location: [facility] INVASIVE CV LAB;  Service: Cardiovascular;  Laterality: N/A;  . Cardiac catheterization N/A 07/10/2015    Procedure: Coronary Stent Intervention;  Surgeon: Lamar Blinks, MD;  Location: [facility] INVASIVE CV LAB;  Service: Cardiovascular;  Laterality: N/A;  . Cardiac catheterization N/A 07/10/2015    Procedure: Coronary Stent Intervention;  Surgeon: Iran Ouch, MD;  Location: [facility] INVASIVE CV LAB;  Service: Cardiovascular;  Laterality: N/A;     Current Outpatient Rx  Name  Route  Sig  Dispense  Refill  . aspirin EC 81 MG tablet   Oral   Take 81 mg by mouth daily.         Marland Kitchen atorvastatin (LIPITOR) 80 MG tablet   Oral   Take 1 tablet (80 mg total) by mouth daily at 6 PM.   90 tablet   4   . lisinopril (PRINIVIL,ZESTRIL) 5 MG  tablet   Oral   Take 1 tablet (5 mg total) by mouth daily.   90 tablet   4   . metoprolol (LOPRESSOR) 50 MG tablet   Oral   Take 1 tablet (50 mg total) by mouth 2 (two) times daily.   60 tablet   4   . ticagrelor (BRILINTA) 90 MG TABS tablet   Oral   Take 1 tablet (90 mg total) by mouth 2 (two) times daily.   60 tablet   4      Allergies Review of patient's allergies indicates no known allergies.   No family history on file.  Social History Social History  Substance Use Topics  . Smoking status: Former Smoker -- 2.00 packs/day    Types: Cigarettes    Quit date: 07/09/2013  . Smokeless tobacco: Not on file     Comment: current vapor smoker  . Alcohol Use: No    Review of Systems  Constitutional:   No fever or chills. No weight changes Cardiovascular:   Positive as above chest pain. Respiratory:   No dyspnea or cough. Gastrointestinal:   Positive as above upper abdominal pain radiating to the back.  No BRBPR or melena. Genitourinary:   Negative for dysuria or difficulty urinating. Musculoskeletal:  Right groin pain. Skin:   Negative for rash. No bruising Neurological:   No weakness or paresthesias. Psychiatric:  No anxiety or depression.  10-point ROS otherwise negative.  ____________________________________________   PHYSICAL EXAM:  VITAL SIGNS: ED Triage Vitals  Enc Vitals Group     BP 08/11/15 1701 141/61 mmHg     Pulse Rate 08/11/15 1701 85     Resp 08/11/15 1701 18     Temp 08/11/15 1701 98 F (36.7 C)     Temp Source 08/11/15 1701 Oral     SpO2 08/11/15 1701 100 %     Weight --      Height --      Head Cir --      Peak Flow --      Pain Score --      Pain Loc --      Pain Edu? --      Excl. in GC? --     Vital signs reviewed, nursing assessments reviewed.   Constitutional:   Alert and oriented. Well appearing and in no distress. Eyes:   No scleral icterus. No conjunctival pallor. PERRL. EOMI ENT   Head:   Normocephalic and  atraumatic.   Nose:   No congestion/rhinnorhea. No septal hematoma   Mouth/Throat:   MMM, no pharyngeal erythema. No peritonsillar mass.    Neck:   No stridor. No SubQ emphysema. No meningismus. Hematological/Lymphatic/Immunilogical:   No cervical lymphadenopathy. Cardiovascular:   RRR. Symmetric bilateral radial and DP pulses.  No murmurs. Right femoral artery access site tender and swollen. Respiratory:   Normal respiratory effort without tachypnea nor retractions. Breath sounds are clear and equal bilaterally. No wheezes/rales/rhonchi. Gastrointestinal:   Soft with epigastric tenderness. Non distended. There is no CVA tenderness.  No rebound, rigidity, or guarding. No abdominal wall ecchymosis. Genitourinary:   Normal. Musculoskeletal:   Nontender with normal range of motion in all extremities. No joint effusions.  No lower extremity tenderness.  No edema. Neurologic:   Normal speech and language.  CN 2-10 normal. Motor grossly intact. No gross focal neurologic deficits are appreciated.  Skin:    Skin is warm, dry and intact. No rash noted.  No petechiae, purpura, or bullae. Psychiatric:   Mood and affect are normal. 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 It is currently contained. 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  Critical care was necessary to treat or prevent imminent or life-threatening deterioration.  Critical care was time spent personally by me on the following activities: development of treatment plan with patient and/or surrogate as well as nursing, discussions with consultants, evaluation of patient's response to treatment, examination of patient, obtaining history from patient or surrogate, ordering and performing treatments and interventions, ordering and review of laboratory studies, ordering and review of radiographic studies, pulse oximetry and re-evaluation of patient's condition.  ____________________________________________   INITIAL IMPRESSION / ASSESSMENT AND PLAN / ED COURSE  Pertinent labs & imaging results that were available during my care of the patient were reviewed by  me and considered in my medical decision making (see chart for details).  Patient presents with upper abdominal pain and back pain, had outpatient CT angiogram which reveals a ruptured but contained thoracic aortic aneurysm. This is a surgical emergency. I discussed the case with vascular surgery Dr. Wyn Quaker immediately upon completing initial assessment, about 5:10 PM. Have ordered preoperative labs, crossmatched 4 units of red cells and FFP. We'll start IV saline at a low rate, increase in blood pressure starts to go down. Dr. Wyn Quaker reports he already has a team on the way to take this patient to the operating room.  Patient initially seemed somewhat hesitant to take his current condition very seriously, I did relay to him the imminent risk of sudden free rupture which would most certainly result in death.    ----------------------------------------- 6:00 PM on 08/11/2015 -----------------------------------------  Patient remained stable throughout his course in the emergency department. Dr. Wyn Quaker  was at the bedside discussing management with the patient within 5-10 minutes of my phone call to him. Patient taken OR at about 5:50 PM in good condition.   ____________________________________________   FINAL CLINICAL IMPRESSION(S) / ED DIAGNOSES  Final diagnoses:  Ruptured thoracic aortic aneurysm (HCC)      Sharman Cheek, MD 08/11/15 1800

## 2015-08-11 NOTE — H&P (Signed)
[facility] Admission History & Physical  MRN : [id number]  Christian Brennan is a 60 y.o. (02-08-1956) male who presents with chief complaint of  Chief Complaint  Patient presents with  . Abdominal Pain  .  History of Present Illness: patient presents with two month history of upper back pain and epigastric pain.  Had coronary intervention four weeks ago with no relief in symptoms.  He continued to gradually worsen until he was seen in the [place] clinic today and sent to the ER after a CT scan demonstrated a contained rupture of a thoracic aortic aneurysm.  This is over 6 cm in diameter.  This is in the mid descending thoracic aorta.  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 He reports this area to be tender since his heart catheterization last month.  He does not really have any claudication or lower extremity ischemic symptoms.  I have independently reviewed his CT scan.  We are consulted by the ER (Dr. Scotty Court) for evaluation and treatment of the aneurysms  Current Facility-Administered Medications  Medication Dose Route Frequency Provider Last Rate Last Dose  . sodium chloride 0.9 % bolus 1,000 mL  1,000 mL Intravenous Once Sharman Cheek, MD 250 mL/hr at 08/11/15 1713 1,000 mL at 08/11/15 1713   Current Outpatient Prescriptions  Medication Sig Dispense Refill  . aspirin EC 81 MG tablet Take 81 mg by mouth daily.    Marland Kitchen atorvastatin (LIPITOR) 80 MG tablet Take 1 tablet (80 mg total) by mouth daily at 6 PM. 90 tablet 4  . ibuprofen (ADVIL,MOTRIN) 200 MG tablet Take 400-600 mg by mouth every 4 (four) hours as needed for headache or mild pain.    Marland Kitchen lisinopril (PRINIVIL,ZESTRIL) 5 MG tablet Take 1 tablet (5 mg total) by mouth daily. 90 tablet 4  . metoprolol (LOPRESSOR) 50 MG tablet Take 1 tablet (50 mg total) by mouth 2 (two) times daily. 60 tablet 4  . pantoprazole (PROTONIX) 40 MG tablet Take 40 mg by mouth 2 (two)  times daily before a meal.    . sucralfate (CARAFATE) 1 g tablet Take 1 g by mouth 4 (four) times daily -  with meals and at bedtime.    . ticagrelor (BRILINTA) 90 MG TABS tablet Take 1 tablet (90 mg total) by mouth 2 (two) times daily. 60 tablet 4    Past Medical History  Diagnosis Date  . Hypertension     Past Surgical History  Procedure Laterality Date  . Cardiac catheterization N/A 07/10/2015    Procedure: Left Heart Cath and Coronary Angiography;  Surgeon: Lamar Blinks, MD;  Location: [facility] INVASIVE CV LAB;  Service: Cardiovascular;  Laterality: N/A;  . Cardiac catheterization N/A 07/10/2015    Procedure: Coronary Stent Intervention;  Surgeon: Lamar Blinks, MD;  Location: [facility] INVASIVE CV LAB;  Service: Cardiovascular;  Laterality: N/A;  . Cardiac catheterization N/A 07/10/2015    Procedure: Coronary Stent Intervention;  Surgeon: Iran Ouch, MD;  Location: [facility] INVASIVE CV LAB;  Service: Cardiovascular;  Laterality: N/A;    Social History Social History  Substance Use Topics  . Smoking status: Former Smoker -- 2.00 packs/day    Types: Cigarettes    Quit date: 07/09/2013  . Smokeless tobacco: Not on file     Comment: current vapor smoker  . Alcohol Use: No  married, lives with wife  Family History No bleeding disorder, clotting disorder, or autoimmune diseases Son  with H. Pylori and PUD  No Known Allergies   REVIEW OF SYSTEMS (Negative unless checked)  Constitutional: Weight loss  Fever  Chills Cardiac: Chest pain   Chest pressure   Palpitations   Shortness of breath when laying flat   Shortness of breath at rest   Shortness of breath with exertion. Vascular:  Pain in legs with walking   Pain in legs at rest   Pain in legs when laying flat   Claudication   Pain in feet when walking  Pain in feet at rest  Pain in feet when laying flat   History of DVT   Phlebitis   Swelling in legs   Varicose veins   Non-healing  ulcers Pulmonary:   Uses home oxygen   Productive cough   Hemoptysis   Wheeze  COPD   Asthma Neurologic:  Dizziness  Blackouts   Seizures   History of stroke   History of TIA  Aphasia   Temporary blindness   Dysphagia   Weakness or numbness in arms   Weakness or numbness in legs Musculoskeletal:  Arthritis   Joint swelling   Joint pain   Low back pain Hematologic:  Easy bruising  Easy bleeding   Hypercoagulable state   Anemic  Hepatitis Gastrointestinal:  Blood in stool   Vomiting blood  Gastroesophageal reflux/heartburn   Difficulty swallowing. Genitourinary:  Chronic kidney disease   Difficult urination  Frequent urination  Burning with urination   Blood in urine Skin:  Rashes   Ulcers   Wounds Psychological:  History of anxiety    History of major depression.  Physical Examination  Filed Vitals:   08/11/15 1701  BP: 141/61  Pulse: 85  Temp: 98 F (36.7 C)  TempSrc: Oral  Resp: 18  SpO2: 100%   There is no weight on file to calculate BMI. Gen: WD/WN, NAD Head: [place]/AT, No temporalis wasting. Prominent temp pulse not noted. Ear/Nose/Throat: Hearing grossly intact, nares w/o erythema or drainage, oropharynx w/o Erythema/Exudate,  Eyes: PERRLA, EOMI.  Neck: Supple, no nuchal rigidity.  No  JVD.  Pulmonary:  Good air movement, no use of accessory muscles.  Cardiac: RRR, normal S1, S2,  Vascular:  Vessel Right Left  Radial Palpable Palpable  Ulnar Palpable Palpable  Brachial Palpable Palpable  Carotid Palpable, without bruit Palpable, without bruit  Aorta Not palpable N/A  Femoral Enlarged, Palpable Palpable  Popliteal Palpable Palpable  PT Palpable Palpable  DP Palpable Palpable   Gastrointestinal: soft, non-tender/non-distended. No guarding/reflex.  Musculoskeletal: M/S 5/5 throughout.  Extremities without ischemic changes.  No deformity or atrophy.  Neurologic: CN 2-12 intact. Pain and light touch  intact in extremities.  Symmetrical.  Speech is fluent. Motor exam as listed above. Psychiatric: Judgment intact, Mood & affect appropriate for pt's clinical situation. Dermatologic: right groin with tender pulsatile mass Lymph : No Cervical, Axillary, or Inguinal lymphadenopathy.     CBC Lab Results  Component Value Date   WBC 12.2* 07/11/2015   HGB 11.3* 07/11/2015   HCT 33.4* 07/11/2015   MCV 88.4 07/11/2015   PLT 449* 07/11/2015    BMET    Component Value Date/Time   NA 138 07/11/2015 0438   K 3.6 07/11/2015 0438   CL 106 07/11/2015 0438   CO2 19* 07/11/2015 0438   GLUCOSE 105* 07/11/2015 0438   BUN 14 07/11/2015 0438   CREATININE 0.81 07/11/2015 0438   CALCIUM 8.7* 07/11/2015 0438   GFRNONAA >60 07/11/2015 0438   GFRAA >60 07/11/2015 1610  CrCl cannot be calculated (Unknown ideal weight.).  COAG No results found for: INR, PROTIME  Radiology US Abdomen Complete  07/20/2015  CLINICAL DATA:  Pain.  Initial evaluation . EXAM: ABDOMEN ULTRASOUND COMPLETE COMPARISON:  No prior . FINDINGS: Gallbladder: Gallbladder is contracted. No definite gallstones noted. Gallbladder wall thickness 2.3 mm. Negative Murphy sign. Common bile duct: Diameter: 3.4 mm Liver: Liver is echogenic suggesting fatty infiltration and/or hepatocellular disease. No focal hepatic abnormality. IVC: No abnormality visualized. Pancreas: Not visualized due to overlying bowel gas. Spleen: Size and appearance within normal limits. Right Kidney: Length: 11.7 cm. Echogenicity within normal limits. No mass or hydronephrosis visualized. Left Kidney: Length: 10.8 cm. Echogenicity within normal limits. No mass or hydronephrosis visualized. Abdominal aorta: No aneurysm visualized. Other findings: None. IMPRESSION: 1. Gallbladder is contracted. No definite gallstones noted. No biliary distention. 2. Liver is echogenic suggesting fatty infiltration and/or hepatocellular disease . Electronically Signed   By: Maisie Fus  Register    On: 07/20/2015 11:13   Ct Abdomen Pelvis W Contrast  08/11/2015  ADDENDUM REPORT: 08/11/2015 13:33 ADDENDUM: These results were called by telephone at the time of interpretation on 08/11/2015 at 1:32 pm to [facility], FNP, who verbally acknowledged these results. Electronically Signed   By: Charline Bills M.D.   On: 08/11/2015 13:33  08/11/2015  CLINICAL DATA:  Epigastric abdominal pain, belching. Status post cardiac catheterization on 07/10/2015. EXAM: CT ABDOMEN AND PELVIS WITH CONTRAST TECHNIQUE: Multidetector CT imaging of the abdomen and pelvis was performed using the standard protocol following bolus administration of intravenous contrast. CONTRAST:  OMNIPAQUE IOHEXOL 300 MG/ML  SOLN COMPARISON:  Abdominal ultrasound dated 07/20/2015. FINDINGS: Lower chest: Abnormal appearance of the descending thoracic aorta on the uppermost image (series 2/ image 1), notable for a mildly thick wall and irregular outpouching along the right anterolateral margin. This is incompletely visualized and may reflect partial volume averaging, but an irregular aneurysm is certainly not excluded. Platelike opacity in the right middle lobe, possibly atelectasis. Hepatobiliary: Liver is within normal limits. No suspicious/enhancing hepatic lesions. Gallbladder is unremarkable. No intrahepatic or extrahepatic ductal dilatation. Pancreas: Within normal limits. Spleen: Within normal limits. Adrenals/Urinary Tract: Adrenal glands are within normal limits. Kidneys within normal limits.  No hydronephrosis. Bladder is within normal limits. Stomach/Bowel: Stomach is within normal limits. No evidence of bowel obstruction. Appendix not discretely visualized. Colonic diverticulosis, without evidence of diverticulitis. Vascular/Lymphatic: Atherosclerotic calcifications of the abdominal aorta and branch vessels. No evidence of abdominal aortic aneurysm. 3.2 x 4.8 x 3.8 cm fluid collection with central contrast extravasation/pooling  in the right groin (series 2/image 80), favored to reflect a right common femoral artery pseudoaneurysm. No suspicious abdominopelvic lymphadenopathy. Reproductive: Prostate is unremarkable. Other: No abdominopelvic ascites. Musculoskeletal: Degenerative changes of the visualized thoracolumbar spine. IMPRESSION: Abnormal appearance of the descending thoracic aorta on the uppermost image of this CT abdomen/pelvis, incompletely visualized, irregular aneurysm not excluded. Dedicated CTA chest is suggested. 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 Otherwise, no CT findings to account for the patient's abdominal pain. Electronically Signed: By: Charline Bills M.D. On: 08/11/2015 12:26   Ct Angio Chest Aorta W/cm &/or Wo/cm  08/11/2015  CLINICAL DATA:  Mid upper abdominal pain. Constant belching. Abnormal thoracic aorta on recent abdominal CT. EXAM: CT ANGIOGRAPHY CHEST WITH CONTRAST TECHNIQUE: Multidetector CT imaging of the chest was performed using the standard protocol during bolus administration of intravenous contrast. Multiplanar CT image reconstructions and MIPs were obtained to evaluate the vascular anatomy. CONTRAST:  OMNIPAQUE  IOHEXOL 350 MG/ML SOLN COMPARISON:  Abdominal CT 08/11/2015 FINDINGS: Mediastinum/Lymph Nodes: The descending thoracic aorta is markedly abnormal. There is concern for intramural hematoma in the mid descending thoracic aorta on sequence 4, image 43. The configuration of the mid descending thoracic aorta is markedly abnormal on sequence 4, image 54. The aorta measures 6.1 x 5.4 cm with irregular margins. In particular, there is abnormal appearance along the right side of the thoracic aorta at this level. Findings are highly concerning for a pseudoaneurysm or contained rupture of the descending thoracic aorta. Large amount of mural thrombus around the abnormal thoracic aorta segment with ulcerative plaques. This segment of abnormal thoracic aorta roughly  measures 7 cm in length. Moderate amount of irregular plaque in the distal descending thoracic aorta with normal caliber, measuring 3 cm. Large amount of plaque near the aortic hiatus. There is no significant pericardial or pleural fluid. Multiple small mediastinal lymph nodes. Evidence for coronary artery stents and calcifications. Central pulmonary arteries are patent. Heart size is normal. Lungs/Pleura: The trachea and mainstem bronchi are patent. There is diffuse centrilobular emphysema which is most prominent in the upper lungs. Volume loss in the right middle lobe. Upper abdomen: Celiac trunk and proximal SMA are patent. No acute abnormality in the upper abdomen. Musculoskeletal: There is an age indeterminate fracture of the right posterior eleventh rib. Review of the MIP images confirms the above findings. IMPRESSION: Abnormal descending thoracic aorta. Findings are concerning for a contained aortic rupture or large irregular pseudoaneurysm. Aorta measures up to 6.1 cm greatest diameter. There is thrombus or hematoma surrounding this abnormal aortic segment. Centrilobular emphysema with volume loss in the right middle lobe. Coronary artery disease with coronary artery calcifications and stents. Age-indeterminate right posterior eleventh rib fracture. Critical Value/emergent results were called by telephone at the time of interpretation on 08/11/2015 at 4:03 pm to [facility] , who verbally acknowledged these results. Electronically Signed   By: Richarda Overlie M.D.   On: 08/11/2015 16:24      Assessment/Plan 1. Thoracic aortic aneurysm with what appears to be a contained rupture.  Recommend emergent repair.  Risks and serious nature of the situation discussed with the patient.  Risks of death, paraplegia, arterial injury, renal injury, infection and death all discussed.   2. Right femoral pseudoaneurysm.  Large and painful.  Needs treatment as well.  As we need femoral access for the thoracic stent graft,  will plan repair of the pseudoaneurysm as well at this time 3. CAD. S/p recent intervention creating the pseudoaneurysm.  Will restart antiplatelet therapy after surgery. 4. HTN. Stable on outpatient meds   Capers Hagmann, MD  08/11/2015 5:47 PM

## 2015-08-11 NOTE — Op Note (Signed)
OPERATIVE NOTE   PROCEDURE: 1. US guidance for vascular access, left femoral artery 2. Cutdown for placement of endoprosthesis and repair of right femoral pseudoaneurysm right groin 3. Catheter placement into aorta from bilateral femoral approaches 4. Direct repair of right femoral artery pseudoaneurysm with evacuation of pseudoaneurysm contents and suture repair of the right femoral artery 5. Placement of a 31 mm diameter x 15 cm length Gore TAG Endoprosthesis main body descending thoracic aorta 6. Placement of a 37 mm diameter x 20 cm length Gore TAG Endoprosthesis extension proximally in the descending thoracic aorta below the left subclavian artery 7. Starclose closure device left femoral artery  PRE-OPERATIVE DIAGNOSIS: Ruptured thoracic aortic aneurysm with contained rupture. Right femoral artery pseudoaneurysm with skin necrosis from previous cardiac catheterization  POST-OPERATIVE DIAGNOSIS: same  SURGEON: Festus Barren, MD and Levora Dredge, MD - Co-surgeons  ANESTHESIA: general  ESTIMATED BLOOD LOSS: 10 cc  FINDING(S): 1.  Contained rupture of thoracic aortic aneurysm in the mid thoracic aorta as well as thrombus and disease in the descending distal thoracic aorta. 2.  Large right femoral artery pseudoaneurysm causing skin necrosis  SPECIMEN(S):  none  INDICATIONS:   Christian Brennan is a 60 y.o. 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 There was also mural thrombus and disease in the distal descending thoracic aorta. Complicating the situation was the fact that he had a cardiac catheterization about a month ago and had a large symptomatic right femoral artery pseudoaneurysm skin necrosis and pain. Emergent repair of his thoracic aorta was indicated and repair of his femoral artery pseudoaneurysm was also necessary. The urgent nature of the situation was discussed with  the patient and his wife and he agreed to proceed with surgery.  DESCRIPTION: After obtaining full informed written consent, the patient was brought back to the operating room and placed supine upon the operating table.  The patient received IV antibiotics prior to induction.  After obtaining adequate anesthesia, the patient was prepped and draped in the standard fashion for endovascular aneurysm repair.  We then began by gaining access to the left femoral artery with US guidance with me working on the right and Dr. Gilda Crease working on the left. Imaging was performed with the catheter in the distal abdominal aorta to evaluate the location of the right femoral artery pseudoaneurysm. This was found to be in the mid to distal common femoral artery and not appropriate for endovascular repair so direct repair of the right femoral artery pseudoaneurysm was planned. The left femoral artery was found to be patent and accessed without difficulty with a needle under ultrasound guidance without difficulty on each side and permanent images were recorded.  A vertical incision was created in the proximal right common femoral artery region and we dissected down to the femoral artery to gain control about 4-5 cm proximal to the pseudoaneurysm. We then tediously dissected down the femoral artery to find the pseudoaneurysm and the location off of the distal common femoral artery. Then divided with silk suture ligatures and suture repair was performed with a 5-0 Prolene suture on the distal common femoral artery to repair the pseudoaneurysm. The pseudoaneurysm cavity was anterior and lateral to the actual femoral artery and the contents were evacuated and sent to pathology. The patient was then given  6000 units of intravenous heparin. The Pigtail catheter was placed into the aorta from the  left  side. Direct access was gained to the right common femoral artery through the cutdown for placement of an endoprosthesis. Over a stiff  Lunderquist wire a 24 French sheath was placed up the right femoral artery into the aorta. Using this image, we selected a 31 mm diameter by 15 cm length Main body device.  Over a stiff wire, a 24 French sheath was placed already. The main body was then placed through the 24 French sheath from the right side. The primary device was deployed just above the celiac artery to encompass the distal descending thoracic aorta mural thrombus and disease but this did not give [place] adequate coverage proximally into the proximal descending thoracic aorta even though we used the longest device possible for the size. An proximal extension cuff was required. Using the pigtail catheter from the left femoral sheath a 37 mm diameter by 20 cm length proximal extension cuff was selected. This was deployed starting in the most proximal ascending thoracic aorta at about 4 cm distal to the left subclavian artery. There was adequate overlap of about 8-9 cm with this deployment. We postdilated the endoprosthesis with the trilobed balloon. The pigtail catheter was then placed back up into the ascending aorta through the left femoral sheath. Completion angiogram was then performed which showed successful exclusion of the aneurysm in its entirety as well as successful exclusion of the distal descending thoracic aortic mural thrombus and disease with brisk flow through the stent graft and no endoleak identified. The pigtail catheter was then removed from the left femoral sheath and a StarClose closure device was deployed on the left with excellent hemostatic result. The skin incision was closed with a 4-0 Monocryl. The right femoral arteriotomy was closed with a series of 5 interrupted 5-0 Prolene sutures in the usual fashion. Surgicel was placed. The right femoral incision was then closed with a layer of 2-0 Vicryl, 2 layers of 3-0 Vicryl, and a 4-0 Monocryl subcuticular stitch for the skin. Dermabond and dressing were placed on both groin  incisions. The patient was taken to the recovery room in stable condition having tolerated the procedure well.  COMPLICATIONS: none  CONDITION: stable  Jeffree Cazeau  08/11/2015, 8:49 PM

## 2015-08-11 NOTE — ED Notes (Signed)
Pt sent here after CT scan showed contained aortic rupture. Initially c/o abd pain, states no pain at this time. Pt tearful and scared in triage.

## 2015-08-11 NOTE — Op Note (Signed)
OPERATIVE NOTE   PROCEDURE: 1. US guidance for vascular access, left femoral artery 2. Cut down right common femoral artery for access for placement of the thoracic endoprosthesis 3. Repair right common femoral pseudoaneurysm 4. Catheter placement into aorta from bilateral femoral approaches 5. Placement of a tag Gore Excluder Endoprosthesis  [id number] 6. Placement of a tag Gore Excluder endoprosthesis proximal extension [id number]  PRE-OPERATIVE DIAGNOSIS: Ruptured thoracic aortic aneurysm; right common femoral pseudoaneurysm secondary to complication of cardiac catheterization  POST-OPERATIVE DIAGNOSIS: same  SURGEON: Levora Dredge, MD and Festus Barren, MD - Co-surgeons  ANESTHESIA: general  ESTIMATED BLOOD LOSS: 30 cc  FINDING(S): 1.  Ruptured thoracic aortic aneurysm; large right common femoral artery pseudoaneurysm  SPECIMEN(S):  none  INDICATIONS:   Christian Brennan is a 60 y.o. y.o. male who presents with increasing back and chest pain associated with a increasingly painful pulsatile mass in the right groin. Patient recently underwent cardiac catheterization and sustained a right groin pseudoaneurysm secondary to his procedure. 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 He also has a large right femoral pseudoaneurysm associated with with necrosis of the skin. The risks and benefits as well as alternative therapies for treatment of been reviewed with the patient all questions of been answered patient agrees to proceed.  DESCRIPTION: After obtaining full informed written consent, the patient was brought back to the operating room and placed supine upon the operating table.  The patient received IV antibiotics prior to induction.  After obtaining  adequate anesthesia, the patient was prepped and draped in the standard fashion for endovascular TAA repair.    Initially ultrasound was placed in a sterile sleeve the left common femoral artery was identified it was echolucent pulsatile and access to the common femoral artery was obtained with a Seldinger needle J-wire was advanced without difficulty and a 5 French sheath was inserted. Pigtail catheter was then advanced into the aorta from the left femoral approach.  With Dr. Wyn Quaker working on the right side and myself on the left, attention was then turned to the right groin where a vertical incision was created and the dissection was carried down through the soft tissues to expose the ileo-inguinal ligament. From this level the common femoral artery was identified and then dissected circumferentially and a Silastic vessel loop was placed. Large circumflex branch was also identified and this was looped with a blue Silastic vessel loop. Attention was then turned to the more distal aspect of the common femoral artery as the dissection was carried in this direction and the neck of the pseudoaneurysm was identified. Silastic vessel loop was placed just proximal to the neck essentially at the mid portion of the common femoral. Using a right angle clamp Dr. Wyn Quaker was able to circumferentially dissect the neck itself and the neck of the pseudoaneurysm was then ligated and divided between 2-0 silk ties. The pseudoaneurysm was then debrided hematoma and pseudocapsule was then passed off the field as specimen. The anterior wall of the common femoral artery which was the puncture site was then oversewn with a 5-0 Prolene.  6000 units of heparin was given and allowed to circulate.  Again focusing on the right common femoral Seldinger needle was inserted under direct vision and a J-wire was advanced 8 [place] sheath was  then placed and a wire advanced into the thoracic aorta Kumpe catheter was advanced to this level. This  represents the introduction of catheter into the aorta from the right side and the pigtail catheter was from the left side.  A Lunderquist wire is then advanced under continuous fluoroscopic guidance positioned with a J at the level of the aortic valve a 24 French sheath is then inserted over the wire. Through the pigtail catheter bolus injection contrast was performed and the rupture is identified as is the origin of the celiac artery. This is then marked and a tag 31 x 31 x 15 which is the longest available 31 stent is opened onto the field it is then deployed beginning just above the origin the celiac artery covering the area of abnormality of the descending thoracic aorta associated with mural thrombus. This however was not long enough to completely treat the aneurysm and therefore a proximal extension cuff was required. The pigtail catheter was then advanced through the first stent and positioned at the top of the aortic arch and a second bolus injection contrast was performed the origin of the subclavian was identified and subsequently a proximal extension was utilized this being a tag 37 x 37 x 20. Once deployed the entire system was angioplastied with a compliant trifold balloon.  Pigtail catheter was then readvanced through the stents into the ascending aorta and a bolus injection contrast was performed demonstrating both stents in excellent position with adequate overlap no evidence of leak no evidence of bird beaking or other abnormality of the stent.  No endoleak was detected on completion angiography. The celiac artery was found to be widely patent.  At this point we elected to terminate the procedure. We secured the starclose device was used for hemostasis on the left femoral artery. The sheath was then removed and the arteriotomy repaired using interrupted 6-0 Prolene. Once hemostasis had been achieved the right groin was then irrigated.  The groin was then closed in multiple layers using 2-0  Vicryl followed by 3-0 Vicryl. The skin incision was closed with a 4-0 Monocryl. Dermabond and pressure dressing were placed. The patient was taken to the recovery room in stable condition having tolerated the procedure well.  COMPLICATIONS: none  CONDITION: stable  Renford Dills  10/18/2014, 3:51 PM

## 2015-08-11 NOTE — Anesthesia Procedure Notes (Addendum)
Procedure Name: Intubation Date/Time: 08/11/2015 6:29 PM Performed by: Lenard Simmer Pre-anesthesia Checklist: Patient identified, Emergency Drugs available, Suction available, Patient being monitored and Timeout performed Patient Re-evaluated:Patient Re-evaluated prior to inductionOxygen Delivery Method: Circle system utilized Preoxygenation: Pre-oxygenation with 100% oxygen Intubation Type: IV induction, Cricoid Pressure applied and Rapid sequence Laryngoscope Size: Miller and 2 Grade View: Grade I Tube type: Oral Tube size: 7.5 mm Number of attempts: 1 Airway Equipment and Method: Stylet Placement Confirmation: ETT inserted through vocal cords under direct vision,  positive ETCO2 and breath sounds checked- equal and bilateral Secured at: 22 cm Tube secured with: Tape Dental Injury: Teeth and Oropharynx as per pre-operative assessment

## 2015-08-11 NOTE — Transfer of Care (Signed)
Immediate Anesthesia Transfer of Care Note  Patient: Christian Brennan  Procedure(s) Performed: Procedure(s): Endovascular Aortic Repair (N/A)  Patient Location: ICU  Anesthesia Type:General  Level of Consciousness: awake, alert  and oriented  Airway & Oxygen Therapy: Patient Spontanous Breathing and Patient connected to nasal cannula oxygen  Post-op Assessment: Report given to RN and Post -op Vital signs reviewed and stable  Post vital signs: Reviewed and stable  Last Vitals:  Filed Vitals:   08/11/15 1710 08/11/15 1730  BP: 142/74 142/76  Pulse: 83 84  Temp:    Resp: 19 19    Complications: No apparent anesthesia complications

## 2015-08-11 NOTE — ED Notes (Signed)
Pt transported to OR

## 2015-08-11 NOTE — Anesthesia Preprocedure Evaluation (Signed)
Anesthesia Evaluation  Patient identified by MRN, date of birth, ID band Patient awake    Reviewed: Allergy & Precautions, H&P , NPO status , Patient's Chart, lab work & pertinent test results, reviewed documented beta blocker date and time   History of Anesthesia Complications Negative for: history of anesthetic complications  Airway Mallampati: III  TM Distance: >3 FB Neck ROM: full    Dental  (+) Poor Dentition, Chipped, Missing   Pulmonary neg pulmonary ROS, former smoker,    Pulmonary exam normal breath sounds clear to auscultation       Cardiovascular Exercise Tolerance: Good hypertension, On Medications + angina at rest + CAD and + Cardiac Stents (DES x 2 placed 4 weeks ago)  (-) Past MI and (-) CABG Normal cardiovascular exam(-) dysrhythmias (-) Valvular Problems/Murmurs Rhythm:regular Rate:Normal     Neuro/Psych negative neurological ROS  negative psych ROS   GI/Hepatic negative GI ROS, Neg liver ROS,   Endo/Other  negative endocrine ROS  Renal/GU negative Renal ROS  negative genitourinary   Musculoskeletal   Abdominal   Peds  Hematology negative hematology ROS (+)   Anesthesia Other Findings Past Medical History:   Hypertension                                                 Reproductive/Obstetrics negative OB ROS                             Anesthesia Physical Anesthesia Plan  ASA: IV and emergent  Anesthesia Plan: General, Rapid Sequence and Cricoid Pressure   Post-op Pain Management:    Induction:   Airway Management Planned:   Additional Equipment:   Intra-op Plan:   Post-operative Plan:   Informed Consent: I have reviewed the patients History and Physical, chart, labs and discussed the procedure including the risks, benefits and alternatives for the proposed anesthesia with the patient or authorized representative who has indicated his/her understanding and  acceptance.   Dental Advisory Given  Plan Discussed with: Anesthesiologist, CRNA and Surgeon  Anesthesia Plan Comments:         Anesthesia Quick Evaluation

## 2015-08-12 DIAGNOSIS — I209 Angina pectoris, unspecified: Secondary | ICD-10-CM

## 2015-08-12 LAB — BASIC METABOLIC PANEL
ANION GAP: 8 (ref 5–15)
BUN: 11 mg/dL (ref 6–20)
CHLORIDE: 107 mmol/L (ref 101–111)
CO2: 22 mmol/L (ref 22–32)
Calcium: 7.9 mg/dL — ABNORMAL LOW (ref 8.9–10.3)
Creatinine, Ser: 0.77 mg/dL (ref 0.61–1.24)
GFR calc non Af Amer: >60 mL/min (ref >60)
Glucose, Bld: 126 mg/dL — ABNORMAL HIGH (ref 65–99)
POTASSIUM: 3.5 mmol/L (ref 3.5–5.1)
SODIUM: 137 mmol/L (ref 135–145)

## 2015-08-12 LAB — PREPARE FRESH FROZEN PLASMA
UNIT DIVISION: 0
Unit division: 0

## 2015-08-12 LAB — TROPONIN I
TROPONIN I: 0.05 ng/mL — AB (ref <0.031)
Troponin I: 0.06 ng/mL — ABNORMAL HIGH (ref <0.031)

## 2015-08-12 LAB — CBC
HCT: 26.7 % — ABNORMAL LOW (ref 40.0–52.0)
HEMOGLOBIN: 8.8 g/dL — AB (ref 13.0–18.0)
MCH: 28.4 pg (ref 26.0–34.0)
MCHC: 33 g/dL (ref 32.0–36.0)
MCV: 86.2 fL (ref 80.0–100.0)
Platelets: 479 10*3/uL — ABNORMAL HIGH (ref 150–440)
RBC: 3.1 MIL/uL — AB (ref 4.40–5.90)
RDW: 14.5 % (ref 11.5–14.5)
WBC: 16.4 10*3/uL — AB (ref 3.8–10.6)

## 2015-08-12 MED ORDER — NITROGLYCERIN 2 % TD OINT
0.5000 [in_us] | TOPICAL_OINTMENT | Freq: Once | TRANSDERMAL | Status: AC
  Administered 2015-08-12: 0.5 [in_us] via TOPICAL
  Filled 2015-08-12: qty 1

## 2015-08-12 MED ORDER — CETYLPYRIDINIUM CHLORIDE 0.05 % MT LIQD
7.0000 mL | Freq: Two times a day (BID) | OROMUCOSAL | Status: DC
  Administered 2015-08-12 – 2015-08-14 (×3): 7 mL via OROMUCOSAL

## 2015-08-12 NOTE — Evaluation (Signed)
Physical Therapy Evaluation Patient Details Name: Christian Brennan MRN: [id number] DOB: [birth year] Today's Date: 08/12/2015   History of Present Illness  Pt admitted for complaints of upper back pain and epigastic pain. Pt with ruptured AAA and pseudoaneurysm. Pt is now POD 1 s/p repair of both injuries.   Clinical Impression  Pt is a pleasant 60 year old male who was admitted for s/p repair of ruptured AAA and R LE femoral pseudoaneurysm. Pt unable to perform mobility at this time secondary to fatigue and pain, however does report getting up with RN staff to recliner.  Pt demonstrates deficits with strength/mobility. Would benefit from skilled PT to address above deficits and promote optimal return to PLOF. Based on strength assessment and report from RN staff, pt shows potential for home discharge, will need HHPT for continued strengthening and proper technique with LRAD.      Follow Up Recommendations Home health PT    Equipment Recommendations  Rolling walker with 5" wheels    Recommendations for Other Services       Precautions / Restrictions Precautions Precautions: Fall Restrictions Weight Bearing Restrictions: No      Mobility  Bed Mobility Overal bed mobility: Needs Assistance             General bed mobility comments: refused at this time as he reports just getting back to bed from recliner  Transfers                    Ambulation/Gait             General Gait Details: per RN report, pt was able to perform mobility to recliner. Pt too fatigued at this time for further mobility attempts  Stairs            Wheelchair Mobility    Modified Rankin (Stroke Patients Only)       Balance                                             Pertinent Vitals/Pain Pain Assessment: 0-10 Pain Score: 8  Pain Location: abdomen Pain Descriptors / Indicators: Operative site guarding Pain Intervention(s): Limited activity within  patient's tolerance    Home Living Family/patient expects to be discharged to:: Private residence Living Arrangements: Spouse/significant other Available Help at Discharge: Family (unable to provide 24/7 care at this time) Type of Home: House Home Access: Ramped entrance     Home Layout: Able to live on main level with bedroom/bathroom (has basement ) Home Equipment: None      Prior Function Level of Independence: Independent               Hand Dominance        Extremity/Trunk Assessment   Upper Extremity Assessment: Overall WFL for tasks assessed           Lower Extremity Assessment: Generalized weakness (B LE grossly 4/5)         Communication   Communication: No difficulties  Cognition Arousal/Alertness: Lethargic Behavior During Therapy: WFL for tasks assessed/performed Overall Cognitive Status: Within Functional Limits for tasks assessed                      General Comments      Exercises Other Exercises Other Exercises: supine ther-ex performed including B LE ankle pumps, quad sets, SLRs, SAQ, glut sets,  and hip abd/add. All ther-ex performed x 10 reps with cga for correct technique      Assessment/Plan    PT Assessment Patient needs continued PT services  PT Diagnosis Difficulty walking;Generalized weakness;Acute pain   PT Problem List Decreased strength;Decreased balance;Decreased mobility;Pain  PT Treatment Interventions Gait training;Therapeutic exercise;DME instruction   PT Goals (Current goals can be found in the Care Plan section) Acute Rehab PT Goals Patient Stated Goal: to get out of the hospital PT Goal Formulation: With patient Time For Goal Achievement: 08/26/15 Potential to Achieve Goals: Good    Frequency Min 2X/week   Barriers to discharge        Co-evaluation               End of Session Equipment Utilized During Treatment: Oxygen Activity Tolerance: Patient limited by fatigue;Patient limited by  pain Patient left: in bed;with family/visitor present Nurse Communication: Mobility status         Time: 1610-9604 PT Time Calculation (min) (ACUTE ONLY): 14 min   Charges:   PT Evaluation $PT Eval High Complexity: 1 Procedure     PT G Codes:        Sakshi Sermons Aug 15, 2015, 4:49 PM  Elizabeth Palau, PT, DPT [phone]

## 2015-08-12 NOTE — Consult Note (Signed)
Reason for Consult: Coronary disease thoracic aneurysm pseudoaneurysm Referring Physician: Dr. Lance Coon hospitalist, Dr. Leotis Pain  Christian Brennan is an 60 y.o. male.  HPI: Patient presented with two-month his left history of upper back and epigastric discomfort. Patient had significant therapy with intervention 4 weeks ago with PCI and stent but symptoms did not improve. Patient was seen in the office sent to emergency room for CT scan which showed a contained rupture of the thoracic aneurysm size greater than 6 cm. Patient was treated for right femoral pseudoaneurysm. Patient complains of persistent tenderness of right groin since cardiac cath. Patient denies significant claudication associated chest pain seen in emergency room referred for further evaluation  Past Medical History  Diagnosis Date  . Hypertension     Past Surgical History  Procedure Laterality Date  . Cardiac catheterization N/A 07/10/2015    Procedure: Left Heart Cath and Coronary Angiography;  Surgeon: Corey Skains, MD;  Location: [place] [facility];  Service: Cardiovascular;  Laterality: N/A;  . Cardiac catheterization N/A 07/10/2015    Procedure: Coronary Stent Intervention;  Surgeon: Corey Skains, MD;  Location: [place] [facility];  Service: Cardiovascular;  Laterality: N/A;  . Cardiac catheterization N/A 07/10/2015    Procedure: Coronary Stent Intervention;  Surgeon: Wellington Hampshire, MD;  Location: [place] [facility];  Service: Cardiovascular;  Laterality: N/A;    History reviewed. No pertinent family history.  Social History:  reports that he quit smoking about 2 years ago. His smoking use included Cigarettes. He smoked 2.00 packs per day. He does not have any smokeless tobacco history on file. He reports that he does not drink alcohol or use illicit drugs.  Allergies: No Known Allergies  Medications: I have reviewed the patient's current medications.  Results for orders placed or performed  during the hospital encounter of 08/11/15 (from the past 48 hour(s))  Comprehensive metabolic panel     Status: Abnormal   Collection Time: 08/11/15  5:07 PM  Result Value Ref Range   Sodium 135 135 - 145 mmol/L   Potassium 3.0 (L) 3.5 - 5.1 mmol/L   Chloride 102 101 - 111 mmol/L   CO2 24 22 - 32 mmol/L   Glucose, Bld 110 (H) 65 - 99 mg/dL   BUN 11 6 - 20 mg/dL   Creatinine, Ser 0.93 0.61 - 1.24 mg/dL   Calcium 9.0 8.9 - 10.3 mg/dL   Total Protein 8.2 (H) 6.5 - 8.1 g/dL   Albumin 2.9 (L) 3.5 - 5.0 g/dL   AST 28 15 - 41 U/L   ALT 40 17 - 63 U/L   Alkaline Phosphatase 354 (H) 38 - 126 U/L   Total Bilirubin 0.6 0.3 - 1.2 mg/dL   GFR calc non Af Amer >60 >60 mL/min   GFR calc Af Amer >60 >60 mL/min    Comment: (NOTE) The eGFR has been calculated using the CKD EPI equation. This calculation has not been validated in all clinical situations. eGFR's persistently <60 mL/min signify possible Chronic Kidney Disease.    Anion gap 9 5 - 15  Lipase, blood     Status: None   Collection Time: 08/11/15  5:07 PM  Result Value Ref Range   Lipase 28 11 - 51 U/L  Troponin I     Status: Abnormal   Collection Time: 08/11/15  5:07 PM  Result Value Ref Range   Troponin I 0.05 (H) <0.031 ng/mL    Comment: READ BACK AND VERIFIED WITH  ALLISON PATTERSON AT 1821 ON 08/11/15 RWW        PERSISTENTLY INCREASED TROPONIN VALUES IN THE RANGE OF 0.04-0.49 ng/mL CAN BE SEEN IN:       -UNSTABLE ANGINA       -CONGESTIVE HEART FAILURE       -MYOCARDITIS       -CHEST TRAUMA       -ARRYHTHMIAS       -LATE PRESENTING MYOCARDIAL INFARCTION       -COPD   CLINICAL FOLLOW-UP RECOMMENDED.   CBC with Differential     Status: Abnormal   Collection Time: 08/11/15  5:07 PM  Result Value Ref Range   WBC 12.9 (H) 3.8 - 10.6 K/uL   RBC 3.78 (L) 4.40 - 5.90 MIL/uL   Hemoglobin 10.9 (L) 13.0 - 18.0 g/dL   HCT 33.2 (L) 40.0 - 52.0 %   MCV 87.7 80.0 - 100.0 fL   MCH 28.8 26.0 - 34.0 pg   MCHC 32.8 32.0 - 36.0 g/dL    RDW 14.3 11.5 - 14.5 %   Platelets 597 (H) 150 - 440 K/uL   Neutrophils Relative % 79 %   Neutro Abs 10.2 (H) 1.4 - 6.5 K/uL   Lymphocytes Relative 9 %   Lymphs Abs 1.1 1.0 - 3.6 K/uL   Monocytes Relative 11 %   Monocytes Absolute 1.4 (H) 0.2 - 1.0 K/uL   Eosinophils Relative 1 %   Eosinophils Absolute 0.1 0 - 0.7 K/uL   Basophils Relative 0 %   Basophils Absolute 0.0 0 - 0.1 K/uL  Protime-INR     Status: Abnormal   Collection Time: 08/11/15  5:07 PM  Result Value Ref Range   Prothrombin Time 15.1 (H) 11.4 - 15.0 seconds   INR 1.17   Type and screen [place]     Status: None (Preliminary result)   Collection Time: 08/11/15  5:07 PM  Result Value Ref Range   ABO/RH(D) A POS    Antibody Screen NEG    Sample Expiration 08/14/2015    Unit Number [id number]    Blood Component Type RED CELLS,LR    Unit division 00    Status of Unit REL FROM [facility]    Transfusion Status OK TO TRANSFUSE    Crossmatch Result Compatible    Unit Number [id number]    Blood Component Type RED CELLS,LR    Unit division 00    Status of Unit ALLOCATED    Transfusion Status OK TO TRANSFUSE    Crossmatch Result Compatible    Unit Number [id number]    Blood Component Type RED CELLS,LR    Unit division 00    Status of Unit REL FROM [facility]    Transfusion Status OK TO TRANSFUSE    Crossmatch Result Compatible    Unit Number [id number]    Blood Component Type RED CELLS,LR    Unit division 00    Status of Unit ALLOCATED    Transfusion Status OK TO TRANSFUSE    Crossmatch Result Compatible   APTT     Status: Abnormal   Collection Time: 08/11/15  5:07 PM  Result Value Ref Range   aPTT 41 (H) 24 - 36 seconds    Comment:        IF BASELINE aPTT IS ELEVATED, SUGGEST PATIENT RISK ASSESSMENT BE USED TO DETERMINE APPROPRIATE ANTICOAGULANT THERAPY.   Prepare fresh frozen plasma     Status: None   Collection Time: 08/11/15  5:07 PM  Result Value  Ref Range   Unit Number  [id number]    Blood Component Type THAWED PLASMA    Unit division 00    Status of Unit REL FROM [facility]    Transfusion Status OK TO TRANSFUSE    Unit Number [id number]    Blood Component Type THAWED PLASMA    Unit division 00    Status of Unit REL FROM [facility]    Transfusion Status OK TO TRANSFUSE   Prepare RBC     Status: None   Collection Time: 08/11/15  5:07 PM  Result Value Ref Range   Order Confirmation ORDER PROCESSED BY BLOOD BANK   ABO/Rh     Status: None   Collection Time: 08/11/15  5:08 PM  Result Value Ref Range   ABO/RH(D) A POS   MRSA PCR Screening     Status: None   Collection Time: 08/11/15  9:35 PM  Result Value Ref Range   MRSA by PCR NEGATIVE NEGATIVE    Comment:        The GeneXpert MRSA Assay (FDA approved for NASAL specimens only), is one component of a comprehensive MRSA colonization surveillance program. It is not intended to diagnose MRSA infection nor to guide or monitor treatment for MRSA infections.   CBC     Status: Abnormal   Collection Time: 08/11/15  9:58 PM  Result Value Ref Range   WBC 12.8 (H) 3.8 - 10.6 K/uL   RBC 3.35 (L) 4.40 - 5.90 MIL/uL   Hemoglobin 9.3 (L) 13.0 - 18.0 g/dL   HCT 29.5 (L) 40.0 - 52.0 %   MCV 88.0 80.0 - 100.0 fL   MCH 27.9 26.0 - 34.0 pg   MCHC 31.7 (L) 32.0 - 36.0 g/dL   RDW 14.1 11.5 - 14.5 %   Platelets 495 (H) 150 - 440 K/uL  Troponin I (q 6hr x 3)     Status: None   Collection Time: 08/11/15 11:10 PM  Result Value Ref Range   Troponin I 0.03 <0.031 ng/mL    Comment:        NO INDICATION OF MYOCARDIAL INJURY.   Magnesium     Status: None   Collection Time: 08/11/15 11:10 PM  Result Value Ref Range   Magnesium 1.7 1.7 - 2.4 mg/dL  Potassium     Status: None   Collection Time: 08/11/15 11:10 PM  Result Value Ref Range   Potassium 3.6 3.5 - 5.1 mmol/L  CBC     Status: Abnormal   Collection Time: 08/12/15  4:30 AM  Result Value Ref Range   WBC 16.4 (H) 3.8 - 10.6 K/uL   RBC 3.10 (L) 4.40 - 5.90  MIL/uL   Hemoglobin 8.8 (L) 13.0 - 18.0 g/dL   HCT 26.7 (L) 40.0 - 52.0 %   MCV 86.2 80.0 - 100.0 fL   MCH 28.4 26.0 - 34.0 pg   MCHC 33.0 32.0 - 36.0 g/dL   RDW 14.5 11.5 - 14.5 %   Platelets 479 (H) 150 - 440 K/uL  Basic metabolic panel     Status: Abnormal   Collection Time: 08/12/15  4:30 AM  Result Value Ref Range   Sodium 137 135 - 145 mmol/L   Potassium 3.5 3.5 - 5.1 mmol/L   Chloride 107 101 - 111 mmol/L   CO2 22 22 - 32 mmol/L   Glucose, Bld 126 (H) 65 - 99 mg/dL   BUN 11 6 - 20 mg/dL   Creatinine, Ser 0.77 0.61 - 1.24 mg/dL  Calcium 7.9 (L) 8.9 - 10.3 mg/dL   GFR calc non Af Amer >60 >60 mL/min   GFR calc Af Amer >60 >60 mL/min    Comment: (NOTE) The eGFR has been calculated using the CKD EPI equation. This calculation has not been validated in all clinical situations. eGFR's persistently <60 mL/min signify possible Chronic Kidney Disease.    Anion gap 8 5 - 15  Troponin I (q 6hr x 3)     Status: Abnormal   Collection Time: 08/12/15  4:30 AM  Result Value Ref Range   Troponin I 0.05 (H) <0.031 ng/mL    Comment: PREVIOUS RESULT CALLED ALLISON PATTERSON AT 1821 ON 08/11/15 BY [email or web address]        PERSISTENTLY INCREASED TROPONIN VALUES IN THE RANGE OF 0.04-0.49 ng/mL CAN BE SEEN IN:       -UNSTABLE ANGINA       -CONGESTIVE HEART FAILURE       -MYOCARDITIS       -CHEST TRAUMA       -ARRYHTHMIAS       -LATE PRESENTING MYOCARDIAL INFARCTION       -COPD   CLINICAL FOLLOW-UP RECOMMENDED.     Ct Abdomen Pelvis W Contrast  08/11/2015  ADDENDUM REPORT: 08/11/2015 13:33 ADDENDUM: These results were called by telephone at the time of interpretation on 08/11/2015 at 1:32 pm to [facility], FNP, who verbally acknowledged these results. Electronically Signed   By: Julian Hy M.D.   On: 08/11/2015 13:33  08/11/2015  CLINICAL DATA:  Epigastric abdominal pain, belching. Status post cardiac catheterization on 07/10/2015. EXAM: CT ABDOMEN AND PELVIS WITH CONTRAST TECHNIQUE:  Multidetector CT imaging of the abdomen and pelvis was performed using the standard protocol following bolus administration of intravenous contrast. CONTRAST:  167m OMNIPAQUE IOHEXOL 300 MG/ML  SOLN COMPARISON:  Abdominal ultrasound dated 07/20/2015. FINDINGS: Lower chest: Abnormal appearance of the descending thoracic aorta on the uppermost image (series 2/ image 1), notable for a mildly thick wall and irregular outpouching along the right anterolateral margin. This is incompletely visualized and may reflect partial volume averaging, but an irregular aneurysm is certainly not excluded. Platelike opacity in the right middle lobe, possibly atelectasis. Hepatobiliary: Liver is within normal limits. No suspicious/enhancing hepatic lesions. Gallbladder is unremarkable. No intrahepatic or extrahepatic ductal dilatation. Pancreas: Within normal limits. Spleen: Within normal limits. Adrenals/Urinary Tract: Adrenal glands are within normal limits. Kidneys within normal limits.  No hydronephrosis. Bladder is within normal limits. Stomach/Bowel: Stomach is within normal limits. No evidence of bowel obstruction. Appendix not discretely visualized. Colonic diverticulosis, without evidence of diverticulitis. Vascular/Lymphatic: Atherosclerotic calcifications of the abdominal aorta and branch vessels. No evidence of abdominal aortic aneurysm. 3.2 x 4.8 x 3.8 cm fluid collection with central contrast extravasation/pooling in the right groin (series 2/image 80), favored to reflect a right common femoral artery pseudoaneurysm. No suspicious abdominopelvic lymphadenopathy. Reproductive: Prostate is unremarkable. Other: No abdominopelvic ascites. Musculoskeletal: Degenerative changes of the visualized thoracolumbar spine. IMPRESSION: Abnormal appearance of the descending thoracic aorta on the uppermost image of this CT abdomen/pelvis, incompletely visualized, irregular aneurysm not excluded. Dedicated CTA chest is suggested.  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 Otherwise, no CT findings to account for the patient's abdominal pain. Electronically Signed: By: SJulian HyM.D. On: 08/11/2015 12:26   Ct Angio Chest Aorta W/cm &/or Wo/cm  08/11/2015  CLINICAL DATA:  Mid upper abdominal pain. Constant belching. Abnormal thoracic aorta on recent abdominal CT. EXAM: CT ANGIOGRAPHY CHEST WITH CONTRAST TECHNIQUE: Multidetector  CT imaging of the chest was performed using the standard protocol during bolus administration of intravenous contrast. Multiplanar CT image reconstructions and MIPs were obtained to evaluate the vascular anatomy. CONTRAST:  158m OMNIPAQUE IOHEXOL 350 MG/ML SOLN COMPARISON:  Abdominal CT 08/11/2015 FINDINGS: Mediastinum/Lymph Nodes: The descending thoracic aorta is markedly abnormal. There is concern for intramural hematoma in the mid descending thoracic aorta on sequence 4, image 43. The configuration of the mid descending thoracic aorta is markedly abnormal on sequence 4, image 54. The aorta measures 6.1 x 5.4 cm with irregular margins. In particular, there is abnormal appearance along the right side of the thoracic aorta at this level. Findings are highly concerning for a pseudoaneurysm or contained rupture of the descending thoracic aorta. Large amount of mural thrombus around the abnormal thoracic aorta segment with ulcerative plaques. This segment of abnormal thoracic aorta roughly measures 7 cm in length. Moderate amount of irregular plaque in the distal descending thoracic aorta with normal caliber, measuring 3 cm. Large amount of plaque near the aortic hiatus. There is no significant pericardial or pleural fluid. Multiple small mediastinal lymph nodes. Evidence for coronary artery stents and calcifications. Central pulmonary arteries are patent. Heart size is normal. Lungs/Pleura: The trachea and mainstem bronchi are patent. There is diffuse centrilobular emphysema which is most  prominent in the upper lungs. Volume loss in the right middle lobe. Upper abdomen: Celiac trunk and proximal SMA are patent. No acute abnormality in the upper abdomen. Musculoskeletal: There is an age indeterminate fracture of the right posterior eleventh rib. Review of the MIP images confirms the above findings. IMPRESSION: Abnormal descending thoracic aorta. Findings are concerning for a contained aortic rupture or large irregular pseudoaneurysm. Aorta measures up to 6.1 cm greatest diameter. There is thrombus or hematoma surrounding this abnormal aortic segment. Centrilobular emphysema with volume loss in the right middle lobe. Coronary artery disease with coronary artery calcifications and stents. Age-indeterminate right posterior eleventh rib fracture. Critical Value/emergent results were called by telephone at the time of interpretation on 08/11/2015 at 4:03 pm [facility], who verbally acknowledged these results. Electronically Signed   By: AMarkus DaftM.D.   On: 08/11/2015 16:24    Review of Systems  Constitutional: Positive for malaise/fatigue.  HENT: Negative.   Eyes: Negative.   Respiratory: Positive for shortness of breath.   Cardiovascular: Positive for chest pain and leg swelling.  Gastrointestinal: Positive for abdominal pain.  Genitourinary: Negative.   Musculoskeletal: Positive for myalgias and back pain.  Skin: Negative.   Neurological: Positive for weakness.  Endo/Heme/Allergies: Negative.   Psychiatric/Behavioral: Negative.    Blood pressure 128/52, pulse 79, temperature 98.4 F (36.9 C), temperature source Oral, resp. rate 26, height '5\' 3"'  (1.6 m), weight 81.5 kg (179 lb 10.8 oz), SpO2 98 %. 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 PLAN Agree with vascular treatment of thoracic aneurysm Continue blood  pressure control with labetalol lisinopril metoprolol and hydralazine Pain control with Percocet as necessary Hyperlipidemia therapy with Lipitor Continue aspirin Brilinta for PCI and stent placement GERD therapy with Pepcid Agree with pseudoaneurysm therapy. Vascular Recommend refrain from tobacco abuse Continue conservative medical therapy   CALLWOOD,DWAYNE D. 08/12/2015, 4:54 PM

## 2015-08-12 NOTE — Progress Notes (Signed)
Pt bladder scanned. 123ml's noted in bladder. Sanaa Zilberman E 7:08 PM 08/12/2015

## 2015-08-12 NOTE — Progress Notes (Signed)
Pt c/o numbness in bilateral lower legs. Pt feet warm and dry. Pulses +1 bilaterally in dorsalis pedis. Dr. Wyn Quaker notified. Acknowledged. No new orders. Kashis Penley E 2:03 PM 08/12/2015

## 2015-08-12 NOTE — Consult Note (Signed)
[facility] - [place] at [facility]   PATIENT NAME: Christian Brennan    MR#:  [id number]  DATE OF BIRTH:  1956/01/23  DATE OF ADMISSION:  08/11/2015  PRIMARY CARE PHYSICIAN: No PCP Per Patient   REQUESTING/REFERRING PHYSICIAN: Wyn Quaker, MD  CHIEF COMPLAINT:   Chief Complaint  Patient presents with  . Abdominal Pain    HISTORY OF PRESENT ILLNESS:  Christian Brennan  is a 60 y.o. male who presents with ruptured thoracic aortic aneurysm. He was taken by vascular surgery for repair. He is currently status post repair and admitted to ICU, with stable vital signs. However, postop he complained of central substernal chest pressure. EKG showed A. fib with multiple PVCs. Patient does have a known history of cardiac disease with prior cardiac stent placement. Hospitalists were contacted for consultation and further evaluation.  PAST MEDICAL HISTORY:   Past Medical History  Diagnosis Date  . Hypertension     PAST SURGICAL HISTOIRY:   Past Surgical History  Procedure Laterality Date  . Cardiac catheterization N/A 07/10/2015    Procedure: Left Heart Cath and Coronary Angiography;  Surgeon: Lamar Blinks, MD;  Location: [facility] INVASIVE CV LAB;  Service: Cardiovascular;  Laterality: N/A;  . Cardiac catheterization N/A 07/10/2015    Procedure: Coronary Stent Intervention;  Surgeon: Lamar Blinks, MD;  Location: [facility] INVASIVE CV LAB;  Service: Cardiovascular;  Laterality: N/A;  . Cardiac catheterization N/A 07/10/2015    Procedure: Coronary Stent Intervention;  Surgeon: Iran Ouch, MD;  Location: [facility] INVASIVE CV LAB;  Service: Cardiovascular;  Laterality: N/A;    SOCIAL HISTORY:   Social History  Substance Use Topics  . Smoking status: Former Smoker -- 2.00 packs/day    Types: Cigarettes    Quit date: 07/09/2013  . Smokeless tobacco: Not on file     Comment: current vapor smoker  . Alcohol Use: No    FAMILY HISTORY:  History reviewed. No pertinent family  history.  DRUG ALLERGIES:  No Known Allergies  REVIEW OF SYSTEMS:  Review of Systems  Constitutional: Negative for fever, chills, weight loss and malaise/fatigue.  HENT: Negative for ear pain, hearing loss and tinnitus.   Eyes: Negative for blurred vision, double vision, pain and redness.  Respiratory: Negative for cough, hemoptysis and shortness of breath.   Cardiovascular: Positive for chest pain. Negative for palpitations, orthopnea and leg swelling.  Gastrointestinal: Negative for nausea, vomiting, abdominal pain, diarrhea and constipation.  Genitourinary: Negative for dysuria, frequency and hematuria.  Musculoskeletal: Negative for back pain, joint pain and neck pain.  Skin:       No acne, rash, or lesions  Neurological: Negative for dizziness, tremors, focal weakness and weakness.  Endo/Heme/Allergies: Negative for polydipsia. Does not bruise/bleed easily.  Psychiatric/Behavioral: Negative for depression. The patient is not nervous/anxious and does not have insomnia.     MEDICATIONS AT HOME:   Prior to Admission medications   Medication Sig Start Date End Date Taking? Authorizing Provider  aspirin EC 81 MG tablet Take 81 mg by mouth daily.   Yes Historical Provider, MD  atorvastatin (LIPITOR) 80 MG tablet Take 1 tablet (80 mg total) by mouth daily at 6 PM. 07/11/15  Yes Lamar Blinks, MD  ibuprofen (ADVIL,MOTRIN) 200 MG tablet Take 400-600 mg by mouth every 4 (four) hours as needed for headache or mild pain.   Yes Historical Provider, MD  lisinopril (PRINIVIL,ZESTRIL) 5 MG tablet Take 1 tablet (5 mg total) by mouth daily. 07/11/15  Yes Bruce  Mardene Sayer, MD  metoprolol (LOPRESSOR) 50 MG tablet Take 1 tablet (50 mg total) by mouth 2 (two) times daily. 07/11/15  Yes Lamar Blinks, MD  pantoprazole (PROTONIX) 40 MG tablet Take 40 mg by mouth 2 (two) times daily before a meal.   Yes Historical Provider, MD  sucralfate (CARAFATE) 1 g tablet Take 1 g by mouth 4 (four) times daily -   with meals and at bedtime.   Yes Historical Provider, MD  ticagrelor (BRILINTA) 90 MG TABS tablet Take 1 tablet (90 mg total) by mouth 2 (two) times daily. 07/11/15  Yes Lamar Blinks, MD      VITAL SIGNS:   Filed Vitals:   08/11/15 2300 08/11/15 2330 08/11/15 2345 08/12/15 0000  BP: 143/69 136/70 113/68   Pulse: 83 80 88   Temp:    97.7 F (36.5 C)  TempSrc:    Axillary  Resp: 20 16 20    Height:      Weight:      SpO2: 98% 100% 98%    Wt Readings from Last 3 Encounters:  08/11/15 81.5 kg (179 lb 10.8 oz)  07/10/15 83.2 kg (183 lb 6.8 oz)    PHYSICAL EXAMINATION:  Physical Exam  Vitals reviewed. Constitutional: He is oriented to person, place, and time. He appears well-developed and well-nourished. He appears distressed (appears uncomfortable).  HENT:  Head: Normocephalic and atraumatic.  Mouth/Throat: Oropharynx is clear and moist.  Eyes: Conjunctivae and EOM are normal. Pupils are equal, round, and reactive to light. No scleral icterus.  Neck: Normal range of motion. Neck supple. No JVD present. No thyromegaly present.  Cardiovascular: Normal rate, regular rhythm and intact distal pulses.  Exam reveals no gallop and no friction rub.   No murmur heard. Respiratory: Effort normal and breath sounds normal. No respiratory distress. He has no wheezes. He has no rales.  GI: Soft. Bowel sounds are normal. He exhibits no distension. There is no tenderness.  Musculoskeletal: Normal range of motion. He exhibits no edema.  Left femoral sheath in place with some bleeding around insertion site. No arthritis, no gout  Lymphadenopathy:    He has no cervical adenopathy.  Neurological: He is alert and oriented to person, place, and time. No cranial nerve deficit.  No dysarthria, no aphasia  Skin: Skin is warm and dry. No rash noted. No erythema.  Psychiatric: He has a normal mood and affect. His behavior is normal. Judgment and thought content normal.     LABORATORY PANEL:    CBC  Recent Labs Lab 08/11/15 2158  WBC 12.8*  HGB 9.3*  HCT 29.5*  PLT 495*   ------------------------------------------------------------------------------------------------------------------  Chemistries   Recent Labs Lab 08/11/15 1707 08/11/15 2310  NA 135  --   K 3.0* 3.6  CL 102  --   CO2 24  --   GLUCOSE 110*  --   BUN 11  --   CREATININE 0.93  --   CALCIUM 9.0  --   MG  --  1.7  AST 28  --   ALT 40  --   ALKPHOS 354*  --   BILITOT 0.6  --    ------------------------------------------------------------------------------------------------------------------  Cardiac Enzymes  Recent Labs Lab 08/11/15 2310  TROPONINI 0.03   ------------------------------------------------------------------------------------------------------------------  RADIOLOGY:  Ct Abdomen Pelvis W Contrast  08/11/2015  ADDENDUM REPORT: 08/11/2015 13:33 ADDENDUM: These results were called by telephone at the time of interpretation on 08/11/2015 at 1:32 pm to [facility], FNP, who verbally acknowledged these results.  Electronically Signed   By: Charline Bills M.D.   On: 08/11/2015 13:33  08/11/2015  CLINICAL DATA:  Epigastric abdominal pain, belching. Status post cardiac catheterization on 07/10/2015. EXAM: CT ABDOMEN AND PELVIS WITH CONTRAST TECHNIQUE: Multidetector CT imaging of the abdomen and pelvis was performed using the standard protocol following bolus administration of intravenous contrast. CONTRAST:  OMNIPAQUE IOHEXOL 300 MG/ML  SOLN COMPARISON:  Abdominal ultrasound dated 07/20/2015. FINDINGS: Lower chest: Abnormal appearance of the descending thoracic aorta on the uppermost image (series 2/ image 1), notable for a mildly thick wall and irregular outpouching along the right anterolateral margin. This is incompletely visualized and may reflect partial volume averaging, but an irregular aneurysm is certainly not excluded. Platelike opacity in the right middle lobe, possibly  atelectasis. Hepatobiliary: Liver is within normal limits. No suspicious/enhancing hepatic lesions. Gallbladder is unremarkable. No intrahepatic or extrahepatic ductal dilatation. Pancreas: Within normal limits. Spleen: Within normal limits. Adrenals/Urinary Tract: Adrenal glands are within normal limits. Kidneys within normal limits.  No hydronephrosis. Bladder is within normal limits. Stomach/Bowel: Stomach is within normal limits. No evidence of bowel obstruction. Appendix not discretely visualized. Colonic diverticulosis, without evidence of diverticulitis. Vascular/Lymphatic: Atherosclerotic calcifications of the abdominal aorta and branch vessels. No evidence of abdominal aortic aneurysm. 3.2 x 4.8 x 3.8 cm fluid collection with central contrast extravasation/pooling in the right groin (series 2/image 80), favored to reflect a right common femoral artery pseudoaneurysm. No suspicious abdominopelvic lymphadenopathy. Reproductive: Prostate is unremarkable. Other: No abdominopelvic ascites. Musculoskeletal: Degenerative changes of the visualized thoracolumbar spine. IMPRESSION: Abnormal appearance of the descending thoracic aorta on the uppermost image of this CT abdomen/pelvis, incompletely visualized, irregular aneurysm not excluded. Dedicated CTA chest is suggested. 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 Otherwise, no CT findings to account for the patient's abdominal pain. Electronically Signed: By: Charline Bills M.D. On: 08/11/2015 12:26   Ct Angio Chest Aorta W/cm &/or Wo/cm  08/11/2015  CLINICAL DATA:  Mid upper abdominal pain. Constant belching. Abnormal thoracic aorta on recent abdominal CT. EXAM: CT ANGIOGRAPHY CHEST WITH CONTRAST TECHNIQUE: Multidetector CT imaging of the chest was performed using the standard protocol during bolus administration of intravenous contrast. Multiplanar CT image reconstructions and MIPs were obtained to evaluate the vascular anatomy. CONTRAST:   OMNIPAQUE IOHEXOL 350 MG/ML SOLN COMPARISON:  Abdominal CT 08/11/2015 FINDINGS: Mediastinum/Lymph Nodes: The descending thoracic aorta is markedly abnormal. There is concern for intramural hematoma in the mid descending thoracic aorta on sequence 4, image 43. The configuration of the mid descending thoracic aorta is markedly abnormal on sequence 4, image 54. The aorta measures 6.1 x 5.4 cm with irregular margins. In particular, there is abnormal appearance along the right side of the thoracic aorta at this level. Findings are highly concerning for a pseudoaneurysm or contained rupture of the descending thoracic aorta. Large amount of mural thrombus around the abnormal thoracic aorta segment with ulcerative plaques. This segment of abnormal thoracic aorta roughly measures 7 cm in length. Moderate amount of irregular plaque in the distal descending thoracic aorta with normal caliber, measuring 3 cm. Large amount of plaque near the aortic hiatus. There is no significant pericardial or pleural fluid. Multiple small mediastinal lymph nodes. Evidence for coronary artery stents and calcifications. Central pulmonary arteries are patent. Heart size is normal. Lungs/Pleura: The trachea and mainstem bronchi are patent. There is diffuse centrilobular emphysema which is most prominent in the upper lungs. Volume loss in the right middle lobe. Upper abdomen: Celiac trunk and proximal  SMA are patent. No acute abnormality in the upper abdomen. Musculoskeletal: There is an age indeterminate fracture of the right posterior eleventh rib. Review of the MIP images confirms the above findings. IMPRESSION: Abnormal descending thoracic aorta. Findings are concerning for a contained aortic rupture or large irregular pseudoaneurysm. Aorta measures up to 6.1 cm greatest diameter. There is thrombus or hematoma surrounding this abnormal aortic segment. Centrilobular emphysema with volume loss in the right middle lobe. Coronary artery  disease with coronary artery calcifications and stents. Age-indeterminate right posterior eleventh rib fracture. Critical Value/emergent results were called by telephone at the time of interpretation on 08/11/2015 at 4:03 pm to [facility] , who verbally acknowledged these results. Electronically Signed   By: Richarda Overlie M.D.   On: 08/11/2015 16:24    EKG:   Orders placed or performed during the hospital encounter of 08/11/15  . ED EKG  . ED EKG  . EKG 12-Lead  . EKG 12-Lead    IMPRESSION AND PLAN:  Active Problems:   Ruptured thoracic aortic aneurysm (HCC) - status post endovascular repair, defer to primary vascular surgery team for continued care   Angina pectoris [facility]) - patient has a long-standing history of chest pain and cardiac disease with multiple prior cardiac stent placements. He states that this chest pain is not new, that this is been something intermittently ongoing for the last 6 months or so. However, it is acutely worse postop. Postop H&H was relatively stable from his preoperative values. EKG shows similar morphology to prior study, however his rhythm is currently irregular, likely A. fib, though rate controlled. Initial troponin was borderline barely elevated at 0.03. We will do serial check of his cardiac enzymes tonight, utilize some Nitropaste to see if this improves his symptoms, and order an echocardiogram and cardiology consult for the morning.  We will continue to follow along with this patient for now. We appreciate allowing [place] to participate in the care of this patient.  All the records are reviewed and case discussed with ED provider. Management plans discussed with the patient and/or family.  CODE STATUS:  Code Status History    Date Active Date Inactive Code Status Order ID Comments User Context   07/10/2015  6:12 PM 07/11/2015  3:36 PM Full Code [id number]  Iran Ouch, MD Inpatient      TOTAL TIME TAKING CARE OF THIS PATIENT: 40 minutes.    Grayling Schranz,  Nayleah Gamel FIELDING 08/12/2015, 12:39 AM  [facility]  Office  [phone]  CC: Primary care Physician: No PCP Per Patient

## 2015-08-12 NOTE — Progress Notes (Addendum)
Pt complaining of crushing chest pain, 10/10. Morphine given, no relief. Dr. Wyn Quaker notified. EKG and Troponin labs ordered. Also, told me to put in a Hospitalist consult.

## 2015-08-12 NOTE — Care Management (Signed)
Presented to ED and found to have a contained rupture of thoracic aneurysm.  Emergent repair 2.28. Recent cardiac stent

## 2015-08-12 NOTE — Progress Notes (Signed)
Pt received from OR post repair of ruptured AAA and pseudoaneurysm in R groin. Pts vitals are stable, but complaining of lots of pain. L groin site has PAD and bleeding; new, according to OR nurse. Dr. Wyn Quaker notified. This RN was told to continue monitoring the site, leave pt flat for 2 hrs post-op, and follow the PAD protocol for removing air.

## 2015-08-12 NOTE — Anesthesia Postprocedure Evaluation (Signed)
Anesthesia Post Note  Patient: Christian Brennan  Procedure(s) Performed: Procedure(s) (LRB): Endovascular Aortic Repair (N/A)  Patient location during evaluation: ICU Anesthesia Type: General Level of consciousness: awake and alert Pain management: pain level controlled Vital Signs Assessment: post-procedure vital signs reviewed and stable Respiratory status: spontaneous breathing, nonlabored ventilation, respiratory function stable and patient connected to nasal cannula oxygen Cardiovascular status: blood pressure returned to baseline and stable Postop Assessment: no signs of nausea or vomiting Anesthetic complications: no    Last Vitals:  Filed Vitals:   08/12/15 1200 08/12/15 1300  BP: 125/71 139/57  Pulse: 85 84  Temp: 36.9 C   Resp: 32 25    Last Pain:  Filed Vitals:   08/12/15 1358  PainSc: Asleep                 [name]

## 2015-08-12 NOTE — Progress Notes (Signed)
L groin site still bleeding, manual pressure held for 6 mins 2-3" above site. PAD replaced, inflated with 35 mls.

## 2015-08-12 NOTE — Progress Notes (Signed)
[facility]  Daily Progress Note   Subjective  - 1 Day Post-Op  Chest pain overnight, better this am.  Has history of angina and heart disease.   No signs of bleeding.   Breathing OK  Objective Filed Vitals:   08/12/15 0400 08/12/15 0500 08/12/15 0600 08/12/15 0700  BP: 109/59 142/90 129/69 146/72  Pulse: 78 91 86 96  Temp: 98.3 F (36.8 C)     TempSrc: Oral     Resp: 16 34 22 26  Height:      Weight:      SpO2: 93% 97% 94% 96%    Intake/Output Summary (Last 24 hours) at 08/12/15 0807 Last data filed at 08/12/15 0709  Gross per 24 hour  Intake   3035 ml  Output   1650 ml  Net   1385 ml    PULM  CTAB CV  RRR VASC  Feet warm, access incisions C/D/I  Laboratory CBC    Component Value Date/Time   WBC 16.4* 08/12/2015 0430   HGB 8.8* 08/12/2015 0430   HCT 26.7* 08/12/2015 0430   PLT 479* 08/12/2015 0430    BMET    Component Value Date/Time   NA 137 08/12/2015 0430   K 3.5 08/12/2015 0430   CL 107 08/12/2015 0430   CO2 22 08/12/2015 0430   GLUCOSE 126* 08/12/2015 0430   BUN 11 08/12/2015 0430   CREATININE 0.77 08/12/2015 0430   CALCIUM 7.9* 08/12/2015 0430   GFRNONAA >60 08/12/2015 0430   GFRAA >60 08/12/2015 0430    Assessment/Planning: POD #1 s/p repair of femoral pseudoaneurysm and repair of contained rupture of thoracic aortic aneurysm   Looks pretty good this morning  Access sites/femoral incision C/d/i  Troponin mildly elevated which is not surprising given major stress of ruptured thoracic aneurysm and heart disease.  Can increase activity and diet today  KVO IVF  Take out foley today  Appreciate IM assistance     DEW,JASON  08/12/2015, 8:07 AM

## 2015-08-12 NOTE — Consult Note (Signed)
[facility] - [place] at [facility]                                                                                                                                                                                            Patient Demographics   Ryu Cerreta, is a 60 y.o. male, DOB - 12-11-1955, ZOX:[id number]  Admit date - 08/11/2015   Admitting Physician Annice Needy, MD  Outpatient Primary MD for the patient is No PCP Per Patient   LOS - 1  Subjective: Patient continues to complain of some chest discomfort. And back pain. Which is not improved.     Review of Systems:   CONSTITUTIONAL: No documented fever. No fatigue, weakness. No weight gain, no weight loss.  EYES: No blurry or double vision.  ENT: No tinnitus. No postnasal drip. No redness of the oropharynx.  RESPIRATORY: No cough, no wheeze, no hemoptysis. No dyspnea.  CARDIOVASCULAR: Positive chest pain. No orthopnea. No palpitations. No syncope.  GASTROINTESTINAL: No nausea, no vomiting or diarrhea. No abdominal pain. No melena or hematochezia.  GENITOURINARY: No dysuria or hematuria.  ENDOCRINE: No polyuria or nocturia. No heat or cold intolerance.  HEMATOLOGY: No anemia. No bruising. No bleeding.  INTEGUMENTARY: No rashes. No lesions.  MUSCULOSKELETAL: No arthritis. No swelling. No gout.  NEUROLOGIC: No numbness, tingling, or ataxia. No seizure-type activity.  PSYCHIATRIC: No anxiety. No insomnia. No ADD.    Vitals:   Filed Vitals:   08/12/15 1300 08/12/15 1400 08/12/15 1500 08/12/15 1600  BP: 139/57 123/80 106/63 128/52  Pulse: 84 71 75 79  Temp:      TempSrc:      Resp: Height:      Weight:      SpO2: 93% 97% 97% 98%    Wt Readings from Last 3 Encounters:  08/11/15 81.5 kg (179 lb 10.8 oz)  07/10/15 83.2 kg (183 lb 6.8 oz)     Intake/Output Summary (Last 24 hours) at 08/12/15 1713 Last data filed at 08/12/15 1500  Gross per 24 hour  Intake   3885 ml  Output    1850 ml  Net   2035 ml    Physical Exam:   GENERAL: Pleasant-appearing in no apparent distress.  HEAD, EYES, EARS, NOSE AND THROAT: Atraumatic, normocephalic. Extraocular muscles are intact. Pupils equal and reactive to light. Sclerae anicteric. No conjunctival injection. No oro-pharyngeal erythema.  NECK: Supple. There is no jugular venous distention. No bruits, no lymphadenopathy, no thyromegaly.  HEART: Regular rate and rhythm,. No murmurs, no rubs, no clicks.  LUNGS: Clear to auscultation bilaterally. No rales  or rhonchi. No wheezes.  ABDOMEN: Soft, flat, nontender, nondistended. Has good bowel sounds. No hepatosplenomegaly appreciated.  EXTREMITIES: No evidence of any cyanosis, clubbing, or peripheral edema.  +2 pedal and radial pulses bilaterally.  NEUROLOGIC: The patient is alert, awake, and oriented x3 with no focal motor or sensory deficits appreciated bilaterally.  SKIN: Moist and warm with no rashes appreciated.  Psych: Not anxious, depressed LN: No inguinal LN enlargement    Antibiotics   Anti-infectives    Start     Dose/Rate Route Frequency Ordered Stop   08/11/15 2200  cefUROXime (ZINACEF) 1.5 g in dextrose 5 % 50 mL IVPB     1.5 g 100 mL/hr over 30 Minutes Intravenous Every 12 hours 08/11/15 2125 08/12/15 1023      Medications   Scheduled Meds: . antiseptic oral rinse  7 mL Mouth Rinse BID  . aspirin EC  81 mg Oral Daily  . atorvastatin  80 mg Oral q1800  . docusate sodium  100 mg Oral Daily  . enoxaparin (LOVENOX) injection  40 mg Subcutaneous Q24H  . famotidine (PEPCID) IV  20 mg Intravenous Q12H  . lisinopril  5 mg Oral Daily  . metoprolol  50 mg Oral BID  . sucralfate  1 g Oral TID WC & HS  . ticagrelor  90 mg Oral BID   Continuous Infusions: . sodium chloride Stopped (08/12/15 1622)  . DOPamine    . nitroGLYCERIN     PRN Meds:.sodium chloride, acetaminophen **OR** acetaminophen, alum & mag hydroxide-simeth, guaiFENesin-dextromethorphan,  hydrALAZINE, labetalol, magnesium sulfate 1 - 4 g bolus IVPB, metoprolol, morphine injection, ondansetron, oxyCODONE-acetaminophen, phenol, polyethylene glycol, potassium chloride, sodium phosphate, sorbitol   Data Review:   Micro Results Recent Results (from the past 240 hour(s))  MRSA PCR Screening     Status: None   Collection Time: 08/11/15  9:35 PM  Result Value Ref Range Status   MRSA by PCR NEGATIVE NEGATIVE Final    Comment:        The GeneXpert MRSA Assay (FDA approved for NASAL specimens only), is one component of a comprehensive MRSA colonization surveillance program. It is not intended to diagnose MRSA infection nor to guide or monitor treatment for MRSA infections.     Radiology Reports US Abdomen Complete  07/20/2015  CLINICAL DATA:  Pain.  Initial evaluation . EXAM: ABDOMEN ULTRASOUND COMPLETE COMPARISON:  No prior . FINDINGS: Gallbladder: Gallbladder is contracted. No definite gallstones noted. Gallbladder wall thickness 2.3 mm. Negative Murphy sign. Common bile duct: Diameter: 3.4 mm Liver: Liver is echogenic suggesting fatty infiltration and/or hepatocellular disease. No focal hepatic abnormality. IVC: No abnormality visualized. Pancreas: Not visualized due to overlying bowel gas. Spleen: Size and appearance within normal limits. Right Kidney: Length: 11.7 cm. Echogenicity within normal limits. No mass or hydronephrosis visualized. Left Kidney: Length: 10.8 cm. Echogenicity within normal limits. No mass or hydronephrosis visualized. Abdominal aorta: No aneurysm visualized. Other findings: None. IMPRESSION: 1. Gallbladder is contracted. No definite gallstones noted. No biliary distention. 2. Liver is echogenic suggesting fatty infiltration and/or hepatocellular disease . Electronically Signed   By: Maisie Fus  Register   On: 07/20/2015 11:13   Ct Abdomen Pelvis W Contrast  08/11/2015  ADDENDUM REPORT: 08/11/2015 13:33 ADDENDUM: These results were called by telephone at the  time of interpretation on 08/11/2015 at 1:32 pm to [facility], FNP, who verbally acknowledged these results. Electronically Signed   By: Charline Bills M.D.   On: 08/11/2015 13:33  08/11/2015  CLINICAL DATA:  Epigastric  abdominal pain, belching. Status post cardiac catheterization on 07/10/2015. EXAM: CT ABDOMEN AND PELVIS WITH CONTRAST TECHNIQUE: Multidetector CT imaging of the abdomen and pelvis was performed using the standard protocol following bolus administration of intravenous contrast. CONTRAST:  OMNIPAQUE IOHEXOL 300 MG/ML  SOLN COMPARISON:  Abdominal ultrasound dated 07/20/2015. FINDINGS: Lower chest: Abnormal appearance of the descending thoracic aorta on the uppermost image (series 2/ image 1), notable for a mildly thick wall and irregular outpouching along the right anterolateral margin. This is incompletely visualized and may reflect partial volume averaging, but an irregular aneurysm is certainly not excluded. Platelike opacity in the right middle lobe, possibly atelectasis. Hepatobiliary: Liver is within normal limits. No suspicious/enhancing hepatic lesions. Gallbladder is unremarkable. No intrahepatic or extrahepatic ductal dilatation. Pancreas: Within normal limits. Spleen: Within normal limits. Adrenals/Urinary Tract: Adrenal glands are within normal limits. Kidneys within normal limits.  No hydronephrosis. Bladder is within normal limits. Stomach/Bowel: Stomach is within normal limits. No evidence of bowel obstruction. Appendix not discretely visualized. Colonic diverticulosis, without evidence of diverticulitis. Vascular/Lymphatic: Atherosclerotic calcifications of the abdominal aorta and branch vessels. No evidence of abdominal aortic aneurysm. 3.2 x 4.8 x 3.8 cm fluid collection with central contrast extravasation/pooling in the right groin (series 2/image 80), favored to reflect a right common femoral artery pseudoaneurysm. No suspicious abdominopelvic lymphadenopathy.  Reproductive: Prostate is unremarkable. Other: No abdominopelvic ascites. Musculoskeletal: Degenerative changes of the visualized thoracolumbar spine. IMPRESSION: Abnormal appearance of the descending thoracic aorta on the uppermost image of this CT abdomen/pelvis, incompletely visualized, irregular aneurysm not excluded. Dedicated CTA chest is suggested. 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 Otherwise, no CT findings to account for the patient's abdominal pain. Electronically Signed: By: Charline Bills M.D. On: 08/11/2015 12:26   Ct Angio Chest Aorta W/cm &/or Wo/cm  08/11/2015  CLINICAL DATA:  Mid upper abdominal pain. Constant belching. Abnormal thoracic aorta on recent abdominal CT. EXAM: CT ANGIOGRAPHY CHEST WITH CONTRAST TECHNIQUE: Multidetector CT imaging of the chest was performed using the standard protocol during bolus administration of intravenous contrast. Multiplanar CT image reconstructions and MIPs were obtained to evaluate the vascular anatomy. CONTRAST:  OMNIPAQUE IOHEXOL 350 MG/ML SOLN COMPARISON:  Abdominal CT 08/11/2015 FINDINGS: Mediastinum/Lymph Nodes: The descending thoracic aorta is markedly abnormal. There is concern for intramural hematoma in the mid descending thoracic aorta on sequence 4, image 43. The configuration of the mid descending thoracic aorta is markedly abnormal on sequence 4, image 54. The aorta measures 6.1 x 5.4 cm with irregular margins. In particular, there is abnormal appearance along the right side of the thoracic aorta at this level. Findings are highly concerning for a pseudoaneurysm or contained rupture of the descending thoracic aorta. Large amount of mural thrombus around the abnormal thoracic aorta segment with ulcerative plaques. This segment of abnormal thoracic aorta roughly measures 7 cm in length. Moderate amount of irregular plaque in the distal descending thoracic aorta with normal caliber, measuring 3 cm. Large amount of  plaque near the aortic hiatus. There is no significant pericardial or pleural fluid. Multiple small mediastinal lymph nodes. Evidence for coronary artery stents and calcifications. Central pulmonary arteries are patent. Heart size is normal. Lungs/Pleura: The trachea and mainstem bronchi are patent. There is diffuse centrilobular emphysema which is most prominent in the upper lungs. Volume loss in the right middle lobe. Upper abdomen: Celiac trunk and proximal SMA are patent. No acute abnormality in the upper abdomen. Musculoskeletal: There is an age indeterminate fracture of the right posterior  eleventh rib. Review of the MIP images confirms the above findings. IMPRESSION: Abnormal descending thoracic aorta. Findings are concerning for a contained aortic rupture or large irregular pseudoaneurysm. Aorta measures up to 6.1 cm greatest diameter. There is thrombus or hematoma surrounding this abnormal aortic segment. Centrilobular emphysema with volume loss in the right middle lobe. Coronary artery disease with coronary artery calcifications and stents. Age-indeterminate right posterior eleventh rib fracture. Critical Value/emergent results were called by telephone at the time of interpretation on 08/11/2015 at 4:03 pm to [facility] , who verbally acknowledged these results. Electronically Signed   By: Richarda Overlie M.D.   On: 08/11/2015 16:24     CBC  Recent Labs Lab 08/11/15 1707 08/11/15 2158 08/12/15 0430  WBC 12.9* 12.8* 16.4*  HGB 10.9* 9.3* 8.8*  HCT 33.2* 29.5* 26.7*  PLT 597* 495* 479*  MCV 87.7 88.0 86.2  MCH 28.8 27.9 28.4  MCHC 32.8 31.7* 33.0  RDW 14.3 14.1 14.5  LYMPHSABS 1.1  --   --   MONOABS 1.4*  --   --   EOSABS 0.1  --   --   BASOSABS 0.0  --   --     Chemistries   Recent Labs Lab 08/11/15 1707 08/11/15 2310 08/12/15 0430  NA 135  --  137  K 3.0* 3.6 3.5  CL 102  --  107  CO2 24  --  22  GLUCOSE 110*  --  126*  BUN 11  --  11  CREATININE 0.93  --  0.77  CALCIUM  9.0  --  7.9*  MG  --  1.7  --   AST 28  --   --   ALT 40  --   --   ALKPHOS 354*  --   --   BILITOT 0.6  --   --    ------------------------------------------------------------------------------------------------------------------ estimated creatinine clearance is 91.5 mL/min (by C-G formula based on Cr of 0.77). ------------------------------------------------------------------------------------------------------------------ No results for input(s): HGBA1C in the last 72 hours. ------------------------------------------------------------------------------------------------------------------ No results for input(s): CHOL, HDL, LDLCALC, TRIG, CHOLHDL, LDLDIRECT in the last 72 hours. ------------------------------------------------------------------------------------------------------------------ No results for input(s): TSH, T4TOTAL, T3FREE, THYROIDAB in the last 72 hours.  Invalid input(s): FREET3 ------------------------------------------------------------------------------------------------------------------ No results for input(s): VITAMINB12, FOLATE, FERRITIN, TIBC, IRON, RETICCTPCT in the last 72 hours.  Coagulation profile  Recent Labs Lab 08/11/15 1707  INR 1.17    No results for input(s): DDIMER in the last 72 hours.  Cardiac Enzymes  Recent Labs Lab 08/11/15 1707 08/11/15 2310 08/12/15 0430  TROPONINI 0.05* 0.03 0.05*   ------------------------------------------------------------------------------------------------------------------ Invalid input(s): POCBNP    Assessment & Plan   1. Chest pain with history of coronary artery disease: Was seen by cardiology. Recommended current therapy. There is no evidence of acute MI. Continue aspirin  2. Hypertension continue lisinopril as taking at home as well as Lopressor as well as additional when necessary medications as written per vascular surgery related to his aneurysm. 3. Hyperlipidemia continue atorvastatin 4.  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 History of nicotine addiction smoking cessation provided patient recommended to stop smoking  Code Status History    Date Active Date Inactive Code Status Order ID Comments User Context   07/10/2015  6:12 PM 07/11/2015  3:36 PM Full Code [id number]  Iran Ouch, MD Inpatient               DVT Prophylaxis  Lovenox    Lab Results  Component Value Date  PLT 479* 08/12/2015     Time Spent in minutes    Auburn Bilberry M.D on 08/12/2015 at 5:13 PM  Between 7am to 6pm - Pager - [phone]  After 6pm go to [email or web address] - password [email or web address] [facility]  [facility]   Office  [phone]

## 2015-08-12 NOTE — Progress Notes (Signed)
Dr. Anne Hahn paged. Pt still complaining of crushing chest pain, not radiating, 10/10. Pt states he has had worsening back pain over the last month, and is worsening post-op from lying flat. This RN is hesitant to increase HOB because L groin site is bleeding. Will continue to monitor. Dr. Anne Hahn came up to assess pt. New orders placed.

## 2015-08-13 ENCOUNTER — Inpatient Hospital Stay
Admit: 2015-08-13 | Discharge: 2015-08-13 | Disposition: A | Discharge status: Home / Self Care | Payer: No Typology Code available for payment source | Attending: Internal Medicine | Admitting: Internal Medicine

## 2015-08-13 LAB — BASIC METABOLIC PANEL
ANION GAP: 6 (ref 5–15)
BUN: 13 mg/dL (ref 6–20)
CHLORIDE: 105 mmol/L (ref 101–111)
CO2: 25 mmol/L (ref 22–32)
Calcium: 8.2 mg/dL — ABNORMAL LOW (ref 8.9–10.3)
Creatinine, Ser: 0.89 mg/dL (ref 0.61–1.24)
GFR calc Af Amer: >60 mL/min (ref >60)
GLUCOSE: 120 mg/dL — AB (ref 65–99)
POTASSIUM: 3.9 mmol/L (ref 3.5–5.1)
Sodium: 136 mmol/L (ref 135–145)

## 2015-08-13 LAB — TYPE AND SCREEN
ABO/RH(D): A POS
ANTIBODY SCREEN: NEGATIVE
UNIT DIVISION: 0
Unit division: 0
Unit division: 0
Unit division: 0

## 2015-08-13 LAB — TROPONIN I
TROPONIN I: 0.03 ng/mL (ref <0.031)
Troponin I: 0.04 ng/mL — ABNORMAL HIGH (ref <0.031)
Troponin I: 0.03 ng/mL (ref <0.031)
Troponin I: <0.03 ng/mL (ref <0.031)

## 2015-08-13 LAB — CBC
HCT: 26.6 % — ABNORMAL LOW (ref 40.0–52.0)
HEMOGLOBIN: 8.8 g/dL — AB (ref 13.0–18.0)
MCH: 28.4 pg (ref 26.0–34.0)
MCHC: 33.2 g/dL (ref 32.0–36.0)
MCV: 85.4 fL (ref 80.0–100.0)
PLATELETS: 453 10*3/uL — AB (ref 150–440)
RBC: 3.11 MIL/uL — AB (ref 4.40–5.90)
RDW: 14.2 % (ref 11.5–14.5)
WBC: 15.7 10*3/uL — AB (ref 3.8–10.6)

## 2015-08-13 LAB — PREPARE RBC (CROSSMATCH)

## 2015-08-13 MED ORDER — FAMOTIDINE 20 MG PO TABS
20.0000 mg | ORAL_TABLET | Freq: Every day | ORAL | Status: DC
Start: 2015-08-13 — End: 2015-08-14
  Administered 2015-08-14: 20 mg via ORAL
  Filled 2015-08-13: qty 1

## 2015-08-13 NOTE — Progress Notes (Signed)
[facility]  Daily Progress Note   Subjective  - 2 Days Post-Op  Feeling better.  Eating some but appetite and energy are poor  Objective Filed Vitals:   08/13/15 0800 08/13/15 0900 08/13/15 1000 08/13/15 1100  BP: 146/70 127/68 119/58 114/58  Pulse: 95 89 81 76  Temp:      TempSrc:      Resp: Height:      Weight:      SpO2: 100% 96% 98% 96%    Intake/Output Summary (Last 24 hours) at 08/13/15 1138 Last data filed at 08/13/15 0945  Gross per 24 hour  Intake   1700 ml  Output    500 ml  Net   1200 ml    PULM  CTAB CV  RRR VASC  Feet warm, good cap refill.  Good strength BLE and BUE  Laboratory CBC    Component Value Date/Time   WBC 15.7* 08/13/2015 0429   HGB 8.8* 08/13/2015 0429   HCT 26.6* 08/13/2015 0429   PLT 453* 08/13/2015 0429    BMET    Component Value Date/Time   NA 136 08/13/2015 0429   K 3.9 08/13/2015 0429   CL 105 08/13/2015 0429   CO2 25 08/13/2015 0429   GLUCOSE 120* 08/13/2015 0429   BUN 13 08/13/2015 0429   CREATININE 0.89 08/13/2015 0429   CALCIUM 8.2* 08/13/2015 0429   GFRNONAA >60 08/13/2015 0429   GFRAA >60 08/13/2015 0429    Assessment/Planning: POD #2 s/p TAA repair and repair of right femoral pseudoaneurysm   Doing well  To floor  Increase mobility  Regular diet  May need PT to improve mobility before discharge    Teja Judice  08/13/2015, 11:38 AM

## 2015-08-13 NOTE — Progress Notes (Signed)
Pt transferred to 2-C.  Report called by off-going nurse.  [facility] notified.  Pt left floor via bed with all belongings.

## 2015-08-13 NOTE — Progress Notes (Signed)
Patient offered twice to get up in chair and ambulate in the room. Patient has refused each time. He has not ate his lunch states it tastes awful. Offered to call cafeteria to get something else but refused. Wife offered to pick him something up at a restaurant but refused as well. Patient has complained of back and left leg pain. Percocet given twice. Dr Wyn Quaker notified when he rounded. At this point no additional orders and patient being tx to floor.

## 2015-08-13 NOTE — Care Management (Signed)
Patient has been assessed by physical therapy and there is recommendation for home health physical therapy. Patient will also benefit form home health nurse.  CM made attempt to speak with patient today but patient asked  for CM to come back later.   He is to move out of icu

## 2015-08-13 NOTE — Progress Notes (Addendum)
Physical Therapy Treatment Patient Details Name: Christian Brennan MRN: [id number] DOB: 1956-03-11 Today's Date: 08/13/2015    History of Present Illness Pt admitted for complaints of upper back pain and epigastic pain. Pt with ruptured AAA and pseudoaneurysm. Pt is now POD 1 s/p repair of both injuries.     PT Comments    Pt is making good progress towards goals with increased mobility this date. RW required for mobility as pt still reports numbness in L foot. Safe technique with rw. All mobility performed on room air. Pt motivated to perform therapy and agreeable to sitting in recliner.  Follow Up Recommendations  Home health PT     Equipment Recommendations  Rolling walker with 5" wheels    Recommendations for Other Services       Precautions / Restrictions Precautions Precautions: Fall Restrictions Weight Bearing Restrictions: No    Mobility  Bed Mobility Overal bed mobility: Needs Assistance Bed Mobility: Supine to Sit     Supine to sit: Min guard     General bed mobility comments: safe technique with bed mobility. Once seated at EOB, pt able to sit with supervision  Transfers Overall transfer level: Needs assistance Equipment used: Rolling walker (2 wheeled) Transfers: Sit to/from Stand Sit to Stand: Min guard         General transfer comment: safe technique with transfers. Once standing, pt able to stand with no LOB  Ambulation/Gait Ambulation/Gait assistance: Min guard Ambulation Distance (Feet): 20 Feet Assistive device: Rolling walker (2 wheeled) Gait Pattern/deviations: Step-to pattern     General Gait Details: Pt able to ambulate using rw. All mobility performed on room air with sats WNL. Pt fatigues with limited distance and still complains of L foot numbness during ambulation   Stairs            Wheelchair Mobility    Modified Rankin (Stroke Patients Only)       Balance                                     Cognition Arousal/Alertness: Awake/alert Behavior During Therapy: WFL for tasks assessed/performed Overall Cognitive Status: Within Functional Limits for tasks assessed                      Exercises      General Comments        Pertinent Vitals/Pain Pain Assessment: Faces Faces Pain Scale: Hurts a little bit Pain Location: R groin Pain Descriptors / Indicators: Operative site guarding Pain Intervention(s): Limited activity within patient's tolerance    Home Living                      Prior Function            PT Goals (current goals can now be found in the care plan section) Acute Rehab PT Goals Patient Stated Goal: to get out of the hospital PT Goal Formulation: With patient Time For Goal Achievement: 08/26/15 Potential to Achieve Goals: Good Progress towards PT goals: Progressing toward goals    Frequency  Min 2X/week    PT Plan Current plan remains appropriate    Co-evaluation             End of Session Equipment Utilized During Treatment: Gait belt;Oxygen Activity Tolerance: Patient limited by fatigue;Patient limited by pain Patient left: in chair;with chair alarm set  Time: 1610-9604 PT Time Calculation (min) (ACUTE ONLY): 23 min  Charges:  $Gait Training: 23-37 mins                    G Codes:      Yeny Schmoll 08-17-2015, 5:16 PM Elizabeth Palau, PT, DPT [phone]

## 2015-08-13 NOTE — Progress Notes (Signed)
*  PRELIMINARY RESULTS* °Echocardiogram °2D Echocardiogram has been performed. ° °Jerry R Hege °08/13/2015, 2:29 PM °

## 2015-08-13 NOTE — Progress Notes (Signed)
RN took over care of patient at 3pm, patient now with room assignment going to 217, report called to receiving RN Morrie Sheldon with no further questions.  Patient updated, currently resting in no apparent distress awaiting transfer.  Report given to [facility], RN as well with no futher questions.

## 2015-08-13 NOTE — Progress Notes (Signed)
Initial Nutrition Assessment  DOCUMENTATION CODES:   Severe malnutrition in context of acute illness/injury  INTERVENTION:   Meals and Snacks: Cater to patient preferences, pt agreeable to snacks between meals Medical Food Supplement Therapy: discussed possible options for nutritional supplement as appetite and po intake remain poor, pt declined nutritional supplement at this time  NUTRITION DIAGNOSIS:   Inadequate oral intake related to poor appetite as evidenced by per patient/family report.  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 Hypertension     Diet Order:  Diet regular Room service appropriate?: Yes; Fluid consistency:: Thin   Energy Intake: pt reports no appetite, recorded po intake 0-25% of meals since admission  Food and Nutrition Related history: pt reports appetite has been poor since his cardiac procedure 1 month ago, pt having to force himself to eat, not using nutritional supplements  Skin:  Reviewed, no issues  Last BM:  2/28   Digestive System: some nausea, no vomiting   Recent Labs Lab 08/11/15 1707 08/11/15 2310 08/12/15 0430 08/13/15 0429  NA 135  --  137 136  K 3.0* 3.6 3.5 3.9  CL 102  --  107 105  CO2 24  --  22 25  BUN 11  --  11 13  CREATININE 0.93  --  0.77 0.89  CALCIUM 9.0  --  7.9* 8.2*  MG  --  1.7  --   --   GLUCOSE 110*  --  126* 120*    Glucose Profile: No results for input(s): GLUCAP in the last 72 hours. Meds:   Nutrition  focused physical exam: Nutrition-Focused physical exam completed. Findings are WDL for fat depletion, muscle depletion, and edema.   Height:   Ht Readings from Last 1 Encounters:  08/11/15  (1.6 m)    Weight: reports 20-25 pound wt loss in 1 month; 10.9% wt loss in 1 month  Wt Readings from Last 1 Encounters:  08/11/15 179 lb 10.8 oz (81.5 kg)   BMI:  Body mass index is 31.84 kg/(m^2).  Estimated Nutritional Needs:   Kcal:  1610-9604 kcals (BEE 1259, 1.3 AF, 1.0-1.2 IF)   Protein:  56-67 g (1.0-1.2 g/kg)   Fluid:  1680-1960 mL (30-35 ml/kg)     MODERATE Care Level   Romelle Starcher MS, RD, LDN [phone] Pager  [phone] Weekend/On-Call Pager

## 2015-08-13 NOTE — Consult Note (Signed)
[facility] [place] at [facility]                                                                                                                                                                                            Patient Demographics   Christian Brennan, is a 60 y.o. male, DOB - [birth year], UJW:[id number]  Admit date - 08/11/2015   Admitting Physician Annice Needy, MD  Outpatient Primary MD for the patient is No PCP Per Patient   LOS - 2  Subjective: No acute events overnight. Patient wants to leave the ICU     Review of Systems:   CONSTITUTIONAL: No documented fever. No fatigue, weakness. No weight gain, no weight loss.  EYES: No blurry or double vision.  ENT: No tinnitus. No postnasal drip. No redness of the oropharynx.  RESPIRATORY: No cough, no wheeze, no hemoptysis. No dyspnea.  CARDIOVASCULAR: Positive chest pain. No orthopnea. No palpitations. No syncope.  GASTROINTESTINAL: No nausea, no vomiting or diarrhea. No abdominal pain. No melena or hematochezia.  GENITOURINARY: No dysuria or hematuria.  ENDOCRINE: No polyuria or nocturia. No heat or cold intolerance.  HEMATOLOGY: No anemia. No bruising. No bleeding.  INTEGUMENTARY: No rashes. No lesions.  MUSCULOSKELETAL: No arthritis. No swelling. No gout.  NEUROLOGIC: No numbness, tingling, or ataxia. No seizure-type activity.  PSYCHIATRIC: No anxiety. No insomnia. No ADD.    Vitals:   Filed Vitals:   08/13/15 0800 08/13/15 0900 08/13/15 1000 08/13/15 1100  BP: 146/70 127/68 119/58 114/58  Pulse: 95 89 81 76  Temp:      TempSrc:      Resp: 15 17 20 17   Height:      Weight:      SpO2: 100% 96% 98% 96%    Wt Readings from Last 3 Encounters:  08/11/15 81.5 kg (179 lb 10.8 oz)  07/10/15 83.2 kg (183 lb 6.8 oz)     Intake/Output Summary (Last 24 hours) at 08/13/15 1243 Last data filed at 08/13/15 0945  Gross per 24 hour  Intake   1600 ml  Output    500 ml  Net   1100 ml     Physical Exam:   GENERAL: Pleasant-appearing in no apparent distress.  HEAD, EYES, EARS, NOSE AND THROAT: Atraumatic, normocephalic. Extraocular muscles are intact. Pupils equal and reactive to light. Sclerae anicteric. No conjunctival injection. No oro-pharyngeal erythema.  NECK: Supple. There is no jugular venous distention. No bruits, no lymphadenopathy, no thyromegaly.  HEART: Regular rate and rhythm,. No murmurs, no rubs, no clicks.  LUNGS: Clear to auscultation bilaterally. No rales or rhonchi. No wheezes.  ABDOMEN: Soft, flat, nontender, nondistended. Has good bowel sounds. No hepatosplenomegaly appreciated.  EXTREMITIES: No evidence of any cyanosis, clubbing, or peripheral edema.  +2 pedal and radial pulses bilaterally.  NEUROLOGIC: The patient is alert, awake, and oriented x3 with no focal motor or sensory deficits appreciated bilaterally.  SKIN: Moist and warm with no rashes appreciated.  Psych: Not anxious, depressed LN: No inguinal LN enlargement    Antibiotics   Anti-infectives    Start     Dose/Rate Route Frequency Ordered Stop   08/11/15 2200  cefUROXime (ZINACEF) 1.5 g in dextrose 5 % 50 mL IVPB     1.5 g 100 mL/hr over 30 Minutes Intravenous Every 12 hours 08/11/15 2125 08/12/15 1023      Medications   Scheduled Meds: . antiseptic oral rinse  7 mL Mouth Rinse BID  . aspirin EC  81 mg Oral Daily  . atorvastatin  80 mg Oral q1800  . docusate sodium  100 mg Oral Daily  . enoxaparin (LOVENOX) injection  40 mg Subcutaneous Q24H  . famotidine  20 mg Oral Daily  . lisinopril  5 mg Oral Daily  . metoprolol  50 mg Oral BID  . sucralfate  1 g Oral TID WC & HS  . ticagrelor  90 mg Oral BID   Continuous Infusions: . DOPamine    . nitroGLYCERIN     PRN Meds:.sodium chloride, acetaminophen **OR** acetaminophen, alum & mag hydroxide-simeth, guaiFENesin-dextromethorphan, hydrALAZINE, labetalol, magnesium sulfate 1 - 4 g bolus IVPB, metoprolol, morphine injection,  ondansetron, oxyCODONE-acetaminophen, phenol, polyethylene glycol, potassium chloride, sodium phosphate, sorbitol   Data Review:   Micro Results Recent Results (from the past 240 hour(s))  MRSA PCR Screening     Status: None   Collection Time: 08/11/15  9:35 PM  Result Value Ref Range Status   MRSA by PCR NEGATIVE NEGATIVE Final    Comment:        The GeneXpert MRSA Assay (FDA approved for NASAL specimens only), is one component of a comprehensive MRSA colonization surveillance program. It is not intended to diagnose MRSA infection nor to guide or monitor treatment for MRSA infections.     Radiology Reports US Abdomen Complete  07/20/2015  CLINICAL DATA:  Pain.  Initial evaluation . EXAM: ABDOMEN ULTRASOUND COMPLETE COMPARISON:  No prior . FINDINGS: Gallbladder: Gallbladder is contracted. No definite gallstones noted. Gallbladder wall thickness 2.3 mm. Negative Murphy sign. Common bile duct: Diameter: 3.4 mm Liver: Liver is echogenic suggesting fatty infiltration and/or hepatocellular disease. No focal hepatic abnormality. IVC: No abnormality visualized. Pancreas: Not visualized due to overlying bowel gas. Spleen: Size and appearance within normal limits. Right Kidney: Length: 11.7 cm. Echogenicity within normal limits. No mass or hydronephrosis visualized. Left Kidney: Length: 10.8 cm. Echogenicity within normal limits. No mass or hydronephrosis visualized. Abdominal aorta: No aneurysm visualized. Other findings: None. IMPRESSION: 1. Gallbladder is contracted. No definite gallstones noted. No biliary distention. 2. Liver is echogenic suggesting fatty infiltration and/or hepatocellular disease . Electronically Signed   By: Maisie Fus  Register   On: 07/20/2015 11:13   Ct Abdomen Pelvis W Contrast  08/11/2015  ADDENDUM REPORT: 08/11/2015 13:33 ADDENDUM: These results were called by telephone at the time of interpretation on 08/11/2015 at 1:32 pm to [facility], FNP, who verbally acknowledged  these results. Electronically Signed   By: Charline Bills M.D.   On: 08/11/2015 13:33  08/11/2015  CLINICAL DATA:  Epigastric abdominal pain, belching. Status post cardiac catheterization on 07/10/2015. EXAM: CT ABDOMEN AND PELVIS  WITH CONTRAST TECHNIQUE: Multidetector CT imaging of the abdomen and pelvis was performed using the standard protocol following bolus administration of intravenous contrast. CONTRAST:  OMNIPAQUE IOHEXOL 300 MG/ML  SOLN COMPARISON:  Abdominal ultrasound dated 07/20/2015. FINDINGS: Lower chest: Abnormal appearance of the descending thoracic aorta on the uppermost image (series 2/ image 1), notable for a mildly thick wall and irregular outpouching along the right anterolateral margin. This is incompletely visualized and may reflect partial volume averaging, but an irregular aneurysm is certainly not excluded. Platelike opacity in the right middle lobe, possibly atelectasis. Hepatobiliary: Liver is within normal limits. No suspicious/enhancing hepatic lesions. Gallbladder is unremarkable. No intrahepatic or extrahepatic ductal dilatation. Pancreas: Within normal limits. Spleen: Within normal limits. Adrenals/Urinary Tract: Adrenal glands are within normal limits. Kidneys within normal limits.  No hydronephrosis. Bladder is within normal limits. Stomach/Bowel: Stomach is within normal limits. No evidence of bowel obstruction. Appendix not discretely visualized. Colonic diverticulosis, without evidence of diverticulitis. Vascular/Lymphatic: Atherosclerotic calcifications of the abdominal aorta and branch vessels. No evidence of abdominal aortic aneurysm. 3.2 x 4.8 x 3.8 cm fluid collection with central contrast extravasation/pooling in the right groin (series 2/image 80), favored to reflect a right common femoral artery pseudoaneurysm. No suspicious abdominopelvic lymphadenopathy. Reproductive: Prostate is unremarkable. Other: No abdominopelvic ascites. Musculoskeletal: Degenerative  changes of the visualized thoracolumbar spine. IMPRESSION: Abnormal appearance of the descending thoracic aorta on the uppermost image of this CT abdomen/pelvis, incompletely visualized, irregular aneurysm not excluded. Dedicated CTA chest is suggested. 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 Otherwise, no CT findings to account for the patient's abdominal pain. Electronically Signed: By: Charline Bills M.D. On: 08/11/2015 12:26   Ct Angio Chest Aorta W/cm &/or Wo/cm  08/11/2015  CLINICAL DATA:  Mid upper abdominal pain. Constant belching. Abnormal thoracic aorta on recent abdominal CT. EXAM: CT ANGIOGRAPHY CHEST WITH CONTRAST TECHNIQUE: Multidetector CT imaging of the chest was performed using the standard protocol during bolus administration of intravenous contrast. Multiplanar CT image reconstructions and MIPs were obtained to evaluate the vascular anatomy. CONTRAST:  OMNIPAQUE IOHEXOL 350 MG/ML SOLN COMPARISON:  Abdominal CT 08/11/2015 FINDINGS: Mediastinum/Lymph Nodes: The descending thoracic aorta is markedly abnormal. There is concern for intramural hematoma in the mid descending thoracic aorta on sequence 4, image 43. The configuration of the mid descending thoracic aorta is markedly abnormal on sequence 4, image 54. The aorta measures 6.1 x 5.4 cm with irregular margins. In particular, there is abnormal appearance along the right side of the thoracic aorta at this level. Findings are highly concerning for a pseudoaneurysm or contained rupture of the descending thoracic aorta. Large amount of mural thrombus around the abnormal thoracic aorta segment with ulcerative plaques. This segment of abnormal thoracic aorta roughly measures 7 cm in length. Moderate amount of irregular plaque in the distal descending thoracic aorta with normal caliber, measuring 3 cm. Large amount of plaque near the aortic hiatus. There is no significant pericardial or pleural fluid. Multiple small  mediastinal lymph nodes. Evidence for coronary artery stents and calcifications. Central pulmonary arteries are patent. Heart size is normal. Lungs/Pleura: The trachea and mainstem bronchi are patent. There is diffuse centrilobular emphysema which is most prominent in the upper lungs. Volume loss in the right middle lobe. Upper abdomen: Celiac trunk and proximal SMA are patent. No acute abnormality in the upper abdomen. Musculoskeletal: There is an age indeterminate fracture of the right posterior eleventh rib. Review of the MIP images confirms the above findings. IMPRESSION: Abnormal descending  thoracic aorta. Findings are concerning for a contained aortic rupture or large irregular pseudoaneurysm. Aorta measures up to 6.1 cm greatest diameter. There is thrombus or hematoma surrounding this abnormal aortic segment. Centrilobular emphysema with volume loss in the right middle lobe. Coronary artery disease with coronary artery calcifications and stents. Age-indeterminate right posterior eleventh rib fracture. Critical Value/emergent results were called by telephone at the time of interpretation on 08/11/2015 at 4:03 pm to [facility] , who verbally acknowledged these results. Electronically Signed   By: Richarda Overlie M.D.   On: 08/11/2015 16:24     CBC  Recent Labs Lab 08/11/15 1707 08/11/15 2158 08/12/15 0430 08/13/15 0429  WBC 12.9* 12.8* 16.4* 15.7*  HGB 10.9* 9.3* 8.8* 8.8*  HCT 33.2* 29.5* 26.7* 26.6*  PLT 597* 495* 479* 453*  MCV 87.7 88.0 86.2 85.4  MCH 28.8 27.9 28.4 28.4  MCHC 32.8 31.7* 33.0 33.2  RDW 14.3 14.1 14.5 14.2  LYMPHSABS 1.1  --   --   --   MONOABS 1.4*  --   --   --   EOSABS 0.1  --   --   --   BASOSABS 0.0  --   --   --     Chemistries   Recent Labs Lab 08/11/15 1707 08/11/15 2310 08/12/15 0430 08/13/15 0429  NA 135  --  137 136  K 3.0* 3.6 3.5 3.9  CL 102  --  107 105  CO2 24  --  22 25  GLUCOSE 110*  --  126* 120*  BUN 11  --  11 13  CREATININE 0.93  --   0.77 0.89  CALCIUM 9.0  --  7.9* 8.2*  MG  --  1.7  --   --   AST 28  --   --   --   ALT 40  --   --   --   ALKPHOS 354*  --   --   --   BILITOT 0.6  --   --   --    ------------------------------------------------------------------------------------------------------------------ estimated creatinine clearance is 82.2 mL/min (by C-G formula based on Cr of 0.89). ------------------------------------------------------------------------------------------------------------------ No results for input(s): HGBA1C in the last 72 hours. ------------------------------------------------------------------------------------------------------------------ No results for input(s): CHOL, HDL, LDLCALC, TRIG, CHOLHDL, LDLDIRECT in the last 72 hours. ------------------------------------------------------------------------------------------------------------------ No results for input(s): TSH, T4TOTAL, T3FREE, THYROIDAB in the last 72 hours.  Invalid input(s): FREET3 ------------------------------------------------------------------------------------------------------------------ No results for input(s): VITAMINB12, FOLATE, FERRITIN, TIBC, IRON, RETICCTPCT in the last 72 hours.  Coagulation profile  Recent Labs Lab 08/11/15 1707  INR 1.17    No results for input(s): DDIMER in the last 72 hours.  Cardiac Enzymes  Recent Labs Lab 08/12/15 2253 08/13/15 0429 08/13/15 1106  TROPONINI 0.04* 0.03 0.03   ------------------------------------------------------------------------------------------------------------------ Invalid input(s): POCBNP    Assessment & Plan   1. Chest pain with history of coronary artery disease: Was seen by cardiology. Recommended current therapy. There is no evidence of acute MI. Continue aspirin  Continue BRILLANTA , metoprolol   2. Hypertension continue lisinopril and metoprolol  3. Hyperlipidemia continue atorvastatin 4. Postop day #2 status post TAA repair and  repair of right femoral pseudoaneurysm: Patient is doing well. Plan as per [facility].   5. History of nicotine addiction smoking cessation provided patient recommended to stop smoking  Code Status History    Date Active Date Inactive Code Status Order ID Comments User Context   07/10/2015  6:12 PM 07/11/2015  3:36 PM Full Code [id number]  [facility]  Argentina Donovan, MD Inpatient               DVT Prophylaxis  Lovenox    Lab Results  Component Value Date   PLT 453* 08/13/2015     Time Spent in minutes   28 min  Tyler Run Ducre M.D on 08/13/2015 at 12:43 PM  Between 7am to 6pm - Pager - [phone]  After 6pm go to [email or web address] - password [email or web address] [email or web address]  [facility]   Office  [phone]

## 2015-08-14 DIAGNOSIS — E43 Unspecified severe protein-calorie malnutrition: Secondary | ICD-10-CM | POA: Insufficient documentation

## 2015-08-14 LAB — SURGICAL PATHOLOGY

## 2015-08-14 LAB — TROPONIN I

## 2015-08-14 MED ORDER — OXYCODONE-ACETAMINOPHEN 5-325 MG PO TABS: 1.0000 | ORAL_TABLET | ORAL | Status: DC | PRN

## 2015-08-14 NOTE — Discharge Summary (Addendum)
[facility]    Discharge Summary    Patient ID:  Christian Brennan MRN: [id number] DOB/AGE: 1955/08/26 60 y.o.  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 Hypertension   CAD  Procedure(s): Endovascular Aortic Repair Open repair of right femoral artery pseudoaneurysm  Discharged Condition: good  HPI:  Admitted emergently for repair when contained rupture of thoracic aortic aneurysm and right femoral artery pseudoaneurysm were identified.  Hospital Course:  Christian Brennan is a 60 y.o. male is S/P  Endovascular Aortic Repair of contained rupture of thoracic aortic aneurysm and open repair of right femoral artery pseudoaneurysm Extubated: in OR Physical exam: awake, alert.  Feet warm with good pulses Post-op wounds incisions C/D/I Pt. Ambulating, voiding and taking PO diet without difficulty. Pt pain controlled with PO pain meds. Labs as below Complications:none  Consults:  Treatment Team:  Auburn BilberryShreyang Patel, MD Alwyn Peawayne D Callwood, MD  Significant Diagnostic Studies: CBC Lab Results  Component Value Date   WBC 15.7* 08/13/2015   HGB 8.8* 08/13/2015   HCT 26.6* 08/13/2015   MCV 85.4 08/13/2015   PLT 453* 08/13/2015    BMET    Component Value Date/Time   NA 136 08/13/2015 0429   K 3.9 08/13/2015 0429   CL 105 08/13/2015 0429   CO2 25 08/13/2015 0429   GLUCOSE 120* 08/13/2015 0429   BUN 13 08/13/2015 0429   CREATININE 0.89 08/13/2015 0429   CALCIUM 8.2* 08/13/2015 0429   GFRNONAA >60  08/13/2015 0429   GFRAA >60 08/13/2015 0429   COAG Lab Results  Component Value Date   INR 1.17 08/11/2015     Disposition:  Discharge to :home     Medication List    TAKE these medications        aspirin EC 81 MG tablet  Take 81 mg by mouth daily.     atorvastatin 80 MG tablet  Commonly known as:  LIPITOR  Take 1 tablet (80 mg total) by mouth daily at 6 PM.     ibuprofen 200 MG tablet  Commonly known as:  ADVIL,MOTRIN  Take 400-600 mg by mouth every 4 (four) hours as needed for headache or mild pain.     lisinopril 5 MG tablet  Commonly known as:  PRINIVIL,ZESTRIL  Take 1 tablet (5 mg total) by mouth daily.     metoprolol 50 MG tablet  Commonly known as:  LOPRESSOR  Take 1 tablet (50 mg total) by mouth 2 (two) times daily.     oxyCODONE-acetaminophen 5-325 MG tablet  Commonly known as:  PERCOCET/ROXICET  Take 1-2 tablets by mouth every 4 (four) hours as needed for moderate pain.     pantoprazole 40 MG tablet  Commonly known as:  PROTONIX  Take 40 mg by mouth 2 (two) times daily before a meal.     sucralfate 1 g tablet  Commonly known as:  CARAFATE  Take 1 g by mouth 4 (four) times daily -  with meals and at bedtime.     ticagrelor 90 MG Tabs tablet  Commonly known as:  BRILINTA  Take 1 tablet (90 mg total) by mouth 2 (two) times daily.  Verbal and written Discharge instructions given to the patient. Wound care per Discharge AVS   Signed: Festus Barren, MD  08/14/2015, 12:36 PM

## 2015-08-14 NOTE — Care Management (Signed)
Patient to be discharged today.  PT has recommended home health PT.  Patient was offered choice of agency.  Referral was made to [facility].  Barbara CowerJason with [facility] notified of referral.  Patient did not have a PCP.  Patient does not have PCP. Per patient request [facility]  Follow up is 08/24/15 at 9am with Herbert DeanerMeredith Harris.  Dr. Wyn Quakerew  will write for home health orders until patient sees PCP.  I have faxed the medical release of information for that patient.  RNCM signing off

## 2015-08-14 NOTE — Consult Note (Signed)
[facility] - [place] at [facility]                                                                                                                                                                                            Patient Demographics   Christian Brennan, is a 60 y.o. male, DOB - Dec 14, 1955, JXB:[id number]  Admit date - 08/11/2015   Admitting Physician Annice Needy, MD  Outpatient Primary MD for the patient is No PCP Per Patient   LOS - 3  Subjective: Patient still complaining of some pain. He questions whether he will be able to go home today due to his weakness.     Review of Systems:   CONSTITUTIONAL: No documented fever. No fatigue, weakness. No weight gain, no weight loss.  EYES: No blurry or double vision.  ENT: No tinnitus. No postnasal drip. No redness of the oropharynx.  RESPIRATORY: No cough, no wheeze, no hemoptysis. No dyspnea.  CARDIOVASCULAR: Positive chest pain. No orthopnea. No palpitations. No syncope.  GASTROINTESTINAL: No nausea, no vomiting or diarrhea. No abdominal pain. No melena or hematochezia.  GENITOURINARY: No dysuria or hematuria.  ENDOCRINE: No polyuria or nocturia. No heat or cold intolerance.  HEMATOLOGY: No anemia. No bruising. No bleeding.  INTEGUMENTARY: No rashes. No lesions.  MUSCULOSKELETAL: No arthritis. No swelling. No gout.  NEUROLOGIC: No numbness, tingling, or ataxia. No seizure-type activity.  PSYCHIATRIC: No anxiety. No insomnia. No ADD.    Vitals:   Filed Vitals:   08/13/15 1600 08/13/15 1855 08/13/15 2119 08/14/15 1033  BP:  104/49 133/60 114/54  Pulse: 78 68 82 80  Temp:   98.4 F (36.9 C)   TempSrc:   Oral   Resp: Height:      Weight:      SpO2: 93% 93% 95%     Wt Readings from Last 3 Encounters:  08/11/15 81.5 kg (179 lb 10.8 oz)  07/10/15 83.2 kg (183 lb 6.8 oz)     Intake/Output Summary (Last 24 hours) at 08/14/15 1112 Last data filed at 08/14/15 0900  Gross per 24 hour   Intake    600 ml  Output   1250 ml  Net   -650 ml    Physical Exam:   GENERAL: Pleasant-appearing in no apparent distress.  HEAD, EYES, EARS, NOSE AND THROAT: Atraumatic, normocephalic. Extraocular muscles are intact. Pupils equal and reactive to light. Sclerae anicteric. No conjunctival injection. No oro-pharyngeal erythema.  NECK: Supple. There is no jugular venous distention. No bruits, no lymphadenopathy, no thyromegaly.  HEART: Regular rate and rhythm,. No murmurs, no rubs,  no clicks.  LUNGS: Clear to auscultation bilaterally. No rales or rhonchi. No wheezes.  ABDOMEN: Soft, flat, nontender, nondistended. Has good bowel sounds. No hepatosplenomegaly appreciated.  EXTREMITIES: No evidence of any cyanosis, clubbing, or peripheral edema.  +2 pedal and radial pulses bilaterally.  NEUROLOGIC: The patient is alert, awake, and oriented x3 with no focal motor or sensory deficits appreciated bilaterally.  SKIN: Moist and warm with no rashes appreciated.  Psych: Not anxious, depressed LN: No inguinal LN enlargement    Antibiotics   Anti-infectives    Start     Dose/Rate Route Frequency Ordered Stop   08/11/15 2200  cefUROXime (ZINACEF) 1.5 g in dextrose 5 % 50 mL IVPB     1.5 g 100 mL/hr over 30 Minutes Intravenous Every 12 hours 08/11/15 2125 08/12/15 1023      Medications   Scheduled Meds: . antiseptic oral rinse  7 mL Mouth Rinse BID  . aspirin EC  81 mg Oral Daily  . atorvastatin  80 mg Oral q1800  . docusate sodium  100 mg Oral Daily  . enoxaparin (LOVENOX) injection  40 mg Subcutaneous Q24H  . famotidine  20 mg Oral Daily  . lisinopril  5 mg Oral Daily  . metoprolol  50 mg Oral BID  . sucralfate  1 g Oral TID WC & HS  . ticagrelor  90 mg Oral BID   Continuous Infusions: . DOPamine    . nitroGLYCERIN     PRN Meds:.sodium chloride, acetaminophen **OR** acetaminophen, alum & mag hydroxide-simeth, guaiFENesin-dextromethorphan, hydrALAZINE, labetalol, magnesium sulfate 1  - 4 g bolus IVPB, metoprolol, morphine injection, ondansetron, oxyCODONE-acetaminophen, phenol, polyethylene glycol, potassium chloride, sodium phosphate, sorbitol   Data Review:   Micro Results Recent Results (from the past 240 hour(s))  MRSA PCR Screening     Status: None   Collection Time: 08/11/15  9:35 PM  Result Value Ref Range Status   MRSA by PCR NEGATIVE NEGATIVE Final    Comment:        The GeneXpert MRSA Assay (FDA approved for NASAL specimens only), is one component of a comprehensive MRSA colonization surveillance program. It is not intended to diagnose MRSA infection nor to guide or monitor treatment for MRSA infections.     Radiology Reports US Abdomen Complete  07/20/2015  CLINICAL DATA:  Pain.  Initial evaluation . EXAM: ABDOMEN ULTRASOUND COMPLETE COMPARISON:  No prior . FINDINGS: Gallbladder: Gallbladder is contracted. No definite gallstones noted. Gallbladder wall thickness 2.3 mm. Negative Murphy sign. Common bile duct: Diameter: 3.4 mm Liver: Liver is echogenic suggesting fatty infiltration and/or hepatocellular disease. No focal hepatic abnormality. IVC: No abnormality visualized. Pancreas: Not visualized due to overlying bowel gas. Spleen: Size and appearance within normal limits. Right Kidney: Length: 11.7 cm. Echogenicity within normal limits. No mass or hydronephrosis visualized. Left Kidney: Length: 10.8 cm. Echogenicity within normal limits. No mass or hydronephrosis visualized. Abdominal aorta: No aneurysm visualized. Other findings: None. IMPRESSION: 1. Gallbladder is contracted. No definite gallstones noted. No biliary distention. 2. Liver is echogenic suggesting fatty infiltration and/or hepatocellular disease . Electronically Signed   By: Maisie Fus  Register   On: 07/20/2015 11:13   Ct Abdomen Pelvis W Contrast  08/11/2015  ADDENDUM REPORT: 08/11/2015 13:33 ADDENDUM: These results were called by telephone at the time of interpretation on 08/11/2015 at 1:32 pm  to [facility], FNP, who verbally acknowledged these results. Electronically Signed   By: Charline Bills M.D.   On: 08/11/2015 13:33  08/11/2015  CLINICAL DATA:  Epigastric abdominal pain, belching. Status post cardiac catheterization on 07/10/2015. EXAM: CT ABDOMEN AND PELVIS WITH CONTRAST TECHNIQUE: Multidetector CT imaging of the abdomen and pelvis was performed using the standard protocol following bolus administration of intravenous contrast. CONTRAST:  OMNIPAQUE IOHEXOL 300 MG/ML  SOLN COMPARISON:  Abdominal ultrasound dated 07/20/2015. FINDINGS: Lower chest: Abnormal appearance of the descending thoracic aorta on the uppermost image (series 2/ image 1), notable for a mildly thick wall and irregular outpouching along the right anterolateral margin. This is incompletely visualized and may reflect partial volume averaging, but an irregular aneurysm is certainly not excluded. Platelike opacity in the right middle lobe, possibly atelectasis. Hepatobiliary: Liver is within normal limits. No suspicious/enhancing hepatic lesions. Gallbladder is unremarkable. No intrahepatic or extrahepatic ductal dilatation. Pancreas: Within normal limits. Spleen: Within normal limits. Adrenals/Urinary Tract: Adrenal glands are within normal limits. Kidneys within normal limits.  No hydronephrosis. Bladder is within normal limits. Stomach/Bowel: Stomach is within normal limits. No evidence of bowel obstruction. Appendix not discretely visualized. Colonic diverticulosis, without evidence of diverticulitis. Vascular/Lymphatic: Atherosclerotic calcifications of the abdominal aorta and branch vessels. No evidence of abdominal aortic aneurysm. 3.2 x 4.8 x 3.8 cm fluid collection with central contrast extravasation/pooling in the right groin (series 2/image 80), favored to reflect a right common femoral artery pseudoaneurysm. No suspicious abdominopelvic lymphadenopathy. Reproductive: Prostate is unremarkable. Other: No  abdominopelvic ascites. Musculoskeletal: Degenerative changes of the visualized thoracolumbar spine. IMPRESSION: Abnormal appearance of the descending thoracic aorta on the uppermost image of this CT abdomen/pelvis, incompletely visualized, irregular aneurysm not excluded. Dedicated CTA chest is suggested. 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 Otherwise, no CT findings to account for the patient's abdominal pain. Electronically Signed: By: Charline Bills M.D. On: 08/11/2015 12:26   Ct Angio Chest Aorta W/cm &/or Wo/cm  08/11/2015  CLINICAL DATA:  Mid upper abdominal pain. Constant belching. Abnormal thoracic aorta on recent abdominal CT. EXAM: CT ANGIOGRAPHY CHEST WITH CONTRAST TECHNIQUE: Multidetector CT imaging of the chest was performed using the standard protocol during bolus administration of intravenous contrast. Multiplanar CT image reconstructions and MIPs were obtained to evaluate the vascular anatomy. CONTRAST:  OMNIPAQUE IOHEXOL 350 MG/ML SOLN COMPARISON:  Abdominal CT 08/11/2015 FINDINGS: Mediastinum/Lymph Nodes: The descending thoracic aorta is markedly abnormal. There is concern for intramural hematoma in the mid descending thoracic aorta on sequence 4, image 43. The configuration of the mid descending thoracic aorta is markedly abnormal on sequence 4, image 54. The aorta measures 6.1 x 5.4 cm with irregular margins. In particular, there is abnormal appearance along the right side of the thoracic aorta at this level. Findings are highly concerning for a pseudoaneurysm or contained rupture of the descending thoracic aorta. Large amount of mural thrombus around the abnormal thoracic aorta segment with ulcerative plaques. This segment of abnormal thoracic aorta roughly measures 7 cm in length. Moderate amount of irregular plaque in the distal descending thoracic aorta with normal caliber, measuring 3 cm. Large amount of plaque near the aortic hiatus. There is no  significant pericardial or pleural fluid. Multiple small mediastinal lymph nodes. Evidence for coronary artery stents and calcifications. Central pulmonary arteries are patent. Heart size is normal. Lungs/Pleura: The trachea and mainstem bronchi are patent. There is diffuse centrilobular emphysema which is most prominent in the upper lungs. Volume loss in the right middle lobe. Upper abdomen: Celiac trunk and proximal SMA are patent. No acute abnormality in the upper abdomen. Musculoskeletal: There is an age indeterminate fracture of the right  posterior eleventh rib. Review of the MIP images confirms the above findings. IMPRESSION: Abnormal descending thoracic aorta. Findings are concerning for a contained aortic rupture or large irregular pseudoaneurysm. Aorta measures up to 6.1 cm greatest diameter. There is thrombus or hematoma surrounding this abnormal aortic segment. Centrilobular emphysema with volume loss in the right middle lobe. Coronary artery disease with coronary artery calcifications and stents. Age-indeterminate right posterior eleventh rib fracture. Critical Value/emergent results were called by telephone at the time of interpretation on 08/11/2015 at 4:03 pm to [facility] , who verbally acknowledged these results. Electronically Signed   By: Richarda Overlie M.D.   On: 08/11/2015 16:24     CBC  Recent Labs Lab 08/11/15 1707 08/11/15 2158 08/12/15 0430 08/13/15 0429  WBC 12.9* 12.8* 16.4* 15.7*  HGB 10.9* 9.3* 8.8* 8.8*  HCT 33.2* 29.5* 26.7* 26.6*  PLT 597* 495* 479* 453*  MCV 87.7 88.0 86.2 85.4  MCH 28.8 27.9 28.4 28.4  MCHC 32.8 31.7* 33.0 33.2  RDW 14.3 14.1 14.5 14.2  LYMPHSABS 1.1  --   --   --   MONOABS 1.4*  --   --   --   EOSABS 0.1  --   --   --   BASOSABS 0.0  --   --   --     Chemistries   Recent Labs Lab 08/11/15 1707 08/11/15 2310 08/12/15 0430 08/13/15 0429  NA 135  --  137 136  K 3.0* 3.6 3.5 3.9  CL 102  --  107 105  CO2 24  --  22 25  GLUCOSE 110*   --  126* 120*  BUN 11  --  11 13  CREATININE 0.93  --  0.77 0.89  CALCIUM 9.0  --  7.9* 8.2*  MG  --  1.7  --   --   AST 28  --   --   --   ALT 40  --   --   --   ALKPHOS 354*  --   --   --   BILITOT 0.6  --   --   --    ------------------------------------------------------------------------------------------------------------------ estimated creatinine clearance is 82.2 mL/min (by C-G formula based on Cr of 0.89). ------------------------------------------------------------------------------------------------------------------ No results for input(s): HGBA1C in the last 72 hours. ------------------------------------------------------------------------------------------------------------------ No results for input(s): CHOL, HDL, LDLCALC, TRIG, CHOLHDL, LDLDIRECT in the last 72 hours. ------------------------------------------------------------------------------------------------------------------ No results for input(s): TSH, T4TOTAL, T3FREE, THYROIDAB in the last 72 hours.  Invalid input(s): FREET3 ------------------------------------------------------------------------------------------------------------------ No results for input(s): VITAMINB12, FOLATE, FERRITIN, TIBC, IRON, RETICCTPCT in the last 72 hours.  Coagulation profile  Recent Labs Lab 08/11/15 1707  INR 1.17    No results for input(s): DDIMER in the last 72 hours.  Cardiac Enzymes  Recent Labs Lab 08/13/15 1106 08/13/15 1645 08/14/15 0033  TROPONINI 0.03 <0.03 <0.03   ------------------------------------------------------------------------------------------------------------------ Invalid input(s): POCBNP    Assessment & Plan   1. Chest pain with history of coronary artery disease: Was seen by cardiology. Recommended current therapy. There is no evidence of acute MI. Continue aspirin  Continue BRILLANTA , metoprolol   2. Hypertension continue lisinopril and metoprolol  3. Hyperlipidemia continue  atorvastatin 4. Postop day #3 status post TAA repair and repair of right femoral pseudoaneurysm: Physical therapy is recommended home with home health at discharge. Plan per vascular surgery.  5. History of nicotine addiction smoking cessation provided  He can be discharged with a nicotine patch.  I will sign off thank you for allowing [place]  to participate in the care of the patient. If there are any questions please call the on-call physician.  Discussed with Dr. Wyn Quakerew  Code Status History    Date Active Date Inactive Code Status Order ID Comments User Context   07/10/2015  6:12 PM 07/11/2015  3:36 PM Full Code [id number]  [place] OuchMuhammad A Arida, MD Inpatient               DVT Prophylaxis  Lovenox    Lab Results  Component Value Date   PLT 453* 08/13/2015     Time Spent in minutes   222min  Mairyn Lenahan M.D on 08/14/2015 at 11:12 AM  Between 7am to 6pm - Pager - [phone]  After 6pm go to [email or web address] - password [email or web address] [facility]  [facility]   Office  [phone]

## 2015-08-14 NOTE — Progress Notes (Signed)
 Vein and Vascular Surgery  Daily Progress Note   Subjective  - 3 Days Post-Op  Still with pain and poor mobility.  No major events overnight  Objective Filed Vitals:   08/13/15 1500 08/13/15 1600 08/13/15 1855 08/13/15 2119  BP: 116/59  104/49 133/60  Pulse: 74 78 68 82  Temp:    98.4 F (36.9 C)  TempSrc:    Oral  Resp: 18 18 13 20   Height:      Weight:      SpO2: 98% 93% 93% 95%    Intake/Output Summary (Last 24 hours) at 08/14/15 0847 Last data filed at 08/14/15 0753  Gross per 24 hour  Intake    720 ml  Output   1650 ml  Net   -930 ml    PULM  CTAB CV  RRR VASC  Incisions C/d/i.  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

## 2015-08-14 NOTE — Progress Notes (Signed)
Pt A and O x 4. VSS. Pt tolerating diet well. Minimal complaints of pain with meds given to control. IV removed intact, prescriptions given. Pt voiced understanding of discharge instructions with no further questions. Pt discharged via wheelchair with axillary.

## 2015-09-10 ENCOUNTER — Other Ambulatory Visit: Payer: Self-pay | Admitting: Vascular Surgery

## 2015-09-10 DIAGNOSIS — I711 Thoracic aortic aneurysm, ruptured, unspecified: Secondary | ICD-10-CM

## 2015-11-30 ENCOUNTER — Ambulatory Visit
Admission: RE | Admit: 2015-11-30 | Discharge: 2015-11-30 | Disposition: A | Discharge status: Home / Self Care | Payer: PRIVATE HEALTH INSURANCE | Source: Ambulatory Visit | Attending: Vascular Surgery | Admitting: Vascular Surgery

## 2015-11-30 DIAGNOSIS — Z95828 Presence of other vascular implants and grafts: Secondary | ICD-10-CM | POA: Diagnosis not present

## 2015-11-30 DIAGNOSIS — I711 Thoracic aortic aneurysm, ruptured, unspecified: Secondary | ICD-10-CM

## 2015-11-30 DIAGNOSIS — X58XXXA Exposure to other specified factors, initial encounter: Secondary | ICD-10-CM | POA: Insufficient documentation

## 2015-11-30 DIAGNOSIS — S2231XA Fracture of one rib, right side, initial encounter for closed fracture: Secondary | ICD-10-CM | POA: Diagnosis not present

## 2015-11-30 LAB — POCT I-STAT CREATININE: CREATININE: 0.9 mg/dL (ref 0.61–1.24)

## 2015-11-30 MED ORDER — IOPAMIDOL (ISOVUE-370) INJECTION 76%
100.0000 mL | Freq: Once | INTRAVENOUS | Status: AC | PRN
  Administered 2015-11-30: 100 mL via INTRAVENOUS

## 2016-12-09 ENCOUNTER — Ambulatory Visit (INDEPENDENT_AMBULATORY_CARE_PROVIDER_SITE_OTHER): Payer: Self-pay | Admitting: Vascular Surgery

## 2016-12-09 ENCOUNTER — Encounter (INDEPENDENT_AMBULATORY_CARE_PROVIDER_SITE_OTHER): Payer: Self-pay

## 2017-02-16 ENCOUNTER — Other Ambulatory Visit (INDEPENDENT_AMBULATORY_CARE_PROVIDER_SITE_OTHER): Payer: Self-pay | Admitting: Vascular Surgery

## 2017-02-16 DIAGNOSIS — I7090 Unspecified atherosclerosis: Secondary | ICD-10-CM

## 2017-02-24 ENCOUNTER — Encounter (INDEPENDENT_AMBULATORY_CARE_PROVIDER_SITE_OTHER): Payer: Self-pay

## 2017-02-24 ENCOUNTER — Ambulatory Visit (INDEPENDENT_AMBULATORY_CARE_PROVIDER_SITE_OTHER): Payer: Self-pay | Admitting: Vascular Surgery

## 2017-03-02 DIAGNOSIS — I447 Left bundle-branch block, unspecified: Secondary | ICD-10-CM | POA: Insufficient documentation

## 2017-04-11 ENCOUNTER — Encounter (INDEPENDENT_AMBULATORY_CARE_PROVIDER_SITE_OTHER): Payer: Self-pay

## 2017-04-11 ENCOUNTER — Ambulatory Visit (INDEPENDENT_AMBULATORY_CARE_PROVIDER_SITE_OTHER): Payer: Self-pay | Admitting: Vascular Surgery

## 2017-04-11 ENCOUNTER — Other Ambulatory Visit (INDEPENDENT_AMBULATORY_CARE_PROVIDER_SITE_OTHER): Payer: Self-pay | Admitting: Vascular Surgery

## 2017-04-11 ENCOUNTER — Ambulatory Visit (INDEPENDENT_AMBULATORY_CARE_PROVIDER_SITE_OTHER): Payer: Self-pay

## 2017-04-11 ENCOUNTER — Encounter (INDEPENDENT_AMBULATORY_CARE_PROVIDER_SITE_OTHER): Payer: Self-pay | Admitting: Vascular Surgery

## 2017-04-11 VITALS — BP 128/63 | HR 64 | Resp 18 | Ht 63.0 in | Wt 190.0 lb

## 2017-04-11 DIAGNOSIS — I711 Thoracic aortic aneurysm, ruptured, unspecified: Secondary | ICD-10-CM

## 2017-04-11 DIAGNOSIS — I724 Aneurysm of artery of lower extremity: Secondary | ICD-10-CM

## 2017-04-12 NOTE — Progress Notes (Signed)
Patient not seen as had not had his CT chest yet.  This has been ordered and will see him after that study

## 2017-06-29 DIAGNOSIS — I5022 Chronic systolic (congestive) heart failure: Secondary | ICD-10-CM | POA: Insufficient documentation

## 2018-04-17 ENCOUNTER — Other Ambulatory Visit (INDEPENDENT_AMBULATORY_CARE_PROVIDER_SITE_OTHER): Payer: Self-pay | Admitting: Vascular Surgery

## 2018-04-17 DIAGNOSIS — I712 Thoracic aortic aneurysm, without rupture, unspecified: Secondary | ICD-10-CM

## 2019-07-19 ENCOUNTER — Ambulatory Visit (INDEPENDENT_AMBULATORY_CARE_PROVIDER_SITE_OTHER): Payer: 59 | Admitting: Vascular Surgery

## 2019-07-19 ENCOUNTER — Other Ambulatory Visit: Payer: Self-pay

## 2019-07-19 ENCOUNTER — Encounter (INDEPENDENT_AMBULATORY_CARE_PROVIDER_SITE_OTHER): Payer: Self-pay | Admitting: Vascular Surgery

## 2019-07-19 VITALS — BP 146/64 | HR 55 | Resp 14 | Ht 63.0 in | Wt 193.0 lb

## 2019-07-19 DIAGNOSIS — I711 Thoracic aortic aneurysm, ruptured, unspecified: Secondary | ICD-10-CM

## 2019-07-19 DIAGNOSIS — I10 Essential (primary) hypertension: Secondary | ICD-10-CM

## 2019-07-19 DIAGNOSIS — I724 Aneurysm of artery of lower extremity: Secondary | ICD-10-CM | POA: Diagnosis not present

## 2019-07-19 DIAGNOSIS — E782 Mixed hyperlipidemia: Secondary | ICD-10-CM | POA: Diagnosis not present

## 2019-07-19 NOTE — Assessment & Plan Note (Signed)
lipid control important in reducing the progression of atherosclerotic disease. Continue statin therapy  

## 2019-07-19 NOTE — Assessment & Plan Note (Signed)
Repair was back in 2017 and was checked in 2018, but has not been checked since.  He needs a CT scan to follow this.  I would also bring the CT scan down to his abdomen pelvis to look at the femoral pseudoaneurysm repair in his abdominal aorta.  This will be done in the near future at his convenience and we will see him back to discuss the results.  He can likely be followed every year or every other year if it is stable.

## 2019-07-19 NOTE — Progress Notes (Signed)
MRN : [id number]  Christian Brennan is a 64 y.o. ([birth year]) male who presents with chief complaint of  Chief Complaint  Patient presents with  . New Patient (Initial Visit)    reestablish care from [facility] in 2017  .  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  He was seen about 1 year postoperatively where his follow-up looked good and there were no major issues.  He has been lost to follow-up since then.  He still complains of some pain in his legs and some numbness in the left leg but no major changes.  He is working daily.  He has some swelling in his legs which he wears compression stockings for with good results.  He has not had any imaging of his thoracic aorta since 2018.  He also had a femoral pseudoaneurysm repair at the time and this was done surgically.  Current Outpatient Medications  Medication Sig Dispense Refill  . aspirin EC 81 MG tablet Take 81 mg by mouth daily.    Marland Kitchen atorvastatin (LIPITOR) 80 MG tablet Take 1 tablet (80 mg total) by mouth daily at 6 PM. 90 tablet 4  . ibuprofen (ADVIL,MOTRIN) 200 MG tablet Take 400-600 mg by mouth every 4 (four) hours as needed for headache or mild pain.    . metoprolol (LOPRESSOR) 50 MG tablet Take 1 tablet (50 mg total) by mouth 2 (two) times daily. 60 tablet 4  . sacubitril-valsartan (ENTRESTO) 24-26 MG     . lisinopril (PRINIVIL,ZESTRIL) 5 MG tablet Take 1 tablet (5 mg total) by mouth daily. 90 tablet 4  . oxyCODONE-acetaminophen (PERCOCET/ROXICET) 5-325 MG tablet Take 1-2 tablets by mouth every 4 (four) hours as needed for moderate pain. (Patient not taking: Reported on 04/11/2017) 30 tablet 0  . pantoprazole (PROTONIX) 40 MG tablet Take 40 mg by mouth 2 (two) times daily before a meal.    . sucralfate (CARAFATE) 1 g tablet Take 1 g by mouth 4 (four) times daily -  with meals and at bedtime.    . ticagrelor (BRILINTA) 90  MG TABS tablet Take 1 tablet (90 mg total) by mouth 2 (two) times daily. (Patient not taking: Reported on 04/11/2017) 60 tablet 4   No current facility-administered medications for this visit.    Past Medical History:  Diagnosis Date  . Hypertension     Past Surgical History:  Procedure Laterality Date  . CARDIAC CATHETERIZATION N/A 07/10/2015   Procedure: Left Heart Cath and Coronary Angiography;  Surgeon: Lamar Blinks, MD;  Location: [facility];  Service: Cardiovascular;  Laterality: N/A;  . CARDIAC CATHETERIZATION N/A 07/10/2015   Procedure: Coronary Stent Intervention;  Surgeon: Lamar Blinks, MD;  Location: [facility];  Service: Cardiovascular;  Laterality: N/A;  . CARDIAC CATHETERIZATION N/A 07/10/2015   Procedure: Coronary Stent Intervention;  Surgeon: Iran Ouch, MD;  Location: [facility];  Service: Cardiovascular;  Laterality: N/A;     Social History   Tobacco Use  . Smoking status: Former Smoker    Packs/day: 2.00    Types: Cigarettes    Quit date: 07/09/2013    Years since quitting: 6.0  . Smokeless tobacco: Never Used  . Tobacco comment: current vapor smoker  Substance Use Topics  . Alcohol use: No  . Drug use: No    Family History  Problem Relation Age of  Onset  . Stroke Mother   . Aneurysm Father   . Heart disease Brother      No Known Allergies   REVIEW OF SYSTEMS (Negative unless checked)  Constitutional: [] Weight loss  [] Fever  [] Chills Cardiac: [] Chest pain   [] Chest pressure   [] Palpitations   [] Shortness of breath when laying flat   [] Shortness of breath at rest   [] Shortness of breath with exertion. Vascular:  [] Pain in legs with walking   [] Pain in legs at rest   [] Pain in legs when laying flat   [] Claudication   [] Pain in feet when walking  [] Pain in feet at rest  [] Pain in feet when laying flat   [] History of DVT   [] Phlebitis   [] Swelling in legs   [] Varicose veins   [] Non-healing ulcers Pulmonary:    [] Uses home oxygen   [] Productive cough   [] Hemoptysis   [] Wheeze  [] COPD   [] Asthma Neurologic:  [] Dizziness  [] Blackouts   [] Seizures   [] History of stroke   [] History of TIA  [] Aphasia   [] Temporary blindness   [] Dysphagia   [] Weakness or numbness in arms   [] Weakness or numbness in legs Musculoskeletal:  [] Arthritis   [] Joint swelling   [] Joint pain   [] Low back pain Hematologic:  [] Easy bruising  [] Easy bleeding   [] Hypercoagulable state   [] Anemic   Gastrointestinal:  [] Blood in stool   [] Vomiting blood  [] Gastroesophageal reflux/heartburn   [] Abdominal pain Genitourinary:  [] Chronic kidney disease   [] Difficult urination  [] Frequent urination  [] Burning with urination   [] Hematuria Skin:  [] Rashes   [] Ulcers   [] Wounds Psychological:  [] History of anxiety   []  History of major depression.  Physical Examination  BP (!) 146/64 (BP Location: Right Arm)   Pulse (!) 55   Resp 14   Ht 5\' 3"  (1.6 m)   Wt 193 lb (87.5 kg)   BMI 34.19 kg/m  Gen:  WD/WN, NAD Head: [place]/AT, No temporalis wasting. Ear/Nose/Throat: Hearing grossly intact, nares w/o erythema or drainage Eyes: Conjunctiva clear. Sclera non-icteric Neck: Supple.  Trachea midline Pulmonary:  Good air movement, no use of accessory muscles.  Cardiac: RRR, no JVD Vascular:  Vessel Right Left  Radial Palpable Palpable                                   Gastrointestinal: soft, non-tender/non-distended. No guarding/reflex.  Musculoskeletal: M/S 5/5 throughout.  No deformity or atrophy.  Mild left lower extremity edema. Neurologic: Sensation grossly intact in extremities.  Symmetrical.  Speech is fluent.  Psychiatric: Judgment intact, Mood & affect appropriate for pt's clinical situation. Dermatologic: No rashes or ulcers noted.  No cellulitis or open wounds.       Labs No results found for this or any previous visit (from the past 2160 hour(s)).  Radiology No results found.  Assessment/Plan  Pseudoaneurysm of  femoral artery (HCC) Was repaired at the same time of his thoracic aneurysm repair.  This was done surgically.  On his follow-up CT scan, we can come down on the abdomen pelvis to assess this as well as his abdominal aorta.  Benign essential HTN blood pressure control important in reducing the progression of atherosclerotic disease. On appropriate oral medications.   Mixed hyperlipidemia lipid control important in reducing the progression of atherosclerotic disease. Continue statin therapy   Ruptured thoracic aortic aneurysm (HCC) Repair was back in 2017 and was checked in 2018, but  has not been checked since.  He needs a CT scan to follow this.  I would also bring the CT scan down to his abdomen pelvis to look at the femoral pseudoaneurysm repair in his abdominal aorta.  This will be done in the near future at his convenience and we will see him back to discuss the results.  He can likely be followed every year or every other year if it is stable.    Leotis Pain, MD  07/19/2019 11:56 AM    This note was created with Dragon medical transcription system.  Any errors from dictation are purely unintentional

## 2019-07-19 NOTE — Patient Instructions (Signed)
Thoracic Aortic Aneurysm  An aneurysm is a bulge in an artery. It happens when blood pushes up against a weakened or damaged artery wall. A thoracic aortic aneurysm is an aneurysm that occurs in the first part of the aorta, between the heart and the diaphragm. The aorta is the main artery of the body. It supplies blood from the heart to the rest of the body. Some aneurysms may not cause symptoms or problems. However, a thoracic aortic aneurysm can cause two serious problems:  It can enlarge and burst (rupture).  It can cause blood to flow between the layers of the wall of the aorta through a tear (aortic dissection). Both of these problems are medical emergencies. They can cause bleeding inside the body and can be life-threatening if they are not diagnosed and treated right away. What are the causes? The exact cause of this condition is not known. What increases the risk? The following factors may make you more likely to develop this condition:  Being 65 years of age or older.  Having a family history of aneurysms.  Using tobacco.  Having any of these conditions: ? Hardening of the arteries caused by the buildup of fat and other substances in the lining of a blood vessel (arteriosclerosis). ? Inflammation of the walls of an artery (arteritis). ? A genetic disease that weakens the body's connective tissue, such as Marfan syndrome. ? An injury or trauma to the aorta. ? High blood pressure (hypertension). ? High cholesterol. ? An infection from bacteria, such as syphilis or staphylococcus, in the wall of the aorta (infectious aortitis). What are the signs or symptoms? Symptoms of this condition vary depending on the size of the aneurysm and how fast it is growing. Most grow slowly and do not cause symptoms. When symptoms do occur, they may include:  Pain in the chest, back, sides, or abdomen. The pain may vary in intensity. Sudden, severe pain may indicate that the aneurysm has ruptured.   Hoarseness.  Cough.  Shortness of breath.  Swallowing problems.  Swelling in the face, arms, or legs.  Fever.  Unexplained weight loss. How is this diagnosed? This condition may be diagnosed with:  An ultrasound.  X-rays.  CT scan.  MRI.  A test to check the arteries for damage or blockages (angiogram). Most unruptured thoracic aortic aneurysms cause no symptoms, so they are often found during exams for other conditions. How is this treated? Treatment for this condition depends on:  The size of the aneurysm.  How fast the aneurysm is growing.  Your age.  Risk factors for rupture. Small aneurysms (2.2 inches, or 5.5 cm, or less) may be managed with:  Medicines to: ? Control blood pressure. ? Manage pain. ? Fight infection.  Regular monitoring. This may include an ultrasound or CT scan every year or every 6 months to see if the aneurysm is getting bigger. Large or fast-growing aneurysms may be treated with surgery. Follow these instructions at home: Eating and drinking   Eat a healthy diet. Your health care provider may recommend that you: ? Lower your salt (sodium) intake. In some people, too much salt can raise blood pressure and increase the risk for thoracic aortic aneurysm. ? Avoid foods that are high in saturated fat and cholesterol, such as red meat and full-fat dairy. ? Eat a diet that is low in sugar. ? Increase your fiber intake by including whole grains, vegetables, and fruits in your diet. Eating these foods may help to lower your   blood pressure.  Do not drink alcohol if your health care provider tells you not to drink.  If you drink alcohol: ? Limit how much you use to:  0-1 drink a day for women.  0-2 drinks a day for men. ? Be aware of how much alcohol is in your drink. In the [place] one drink equals one 12 oz bottle of beer (355 mL), one 5 oz glass of wine (148 mL), or one 1 oz glass of hard liquor (44 mL). Lifestyle  Do not use any  products that contain nicotine or tobacco, such as cigarettes, e-cigarettes, and chewing tobacco. If you need help quitting, ask your health care provider.  Maintain a healthy weight.  Check your blood pressure regularly. Follow your health care provider's instructions on how to keep your blood pressure within normal limits.  Have your blood sugar (glucose) level and cholesterol levels checked regularly. Follow your health care provider's instructions on how to keep levels within normal limits. Activity   Stay physically active and exercise regularly. Talk with your health care provider about how often you should exercise and ask which types of exercise are best for you.  Avoid heavy lifting and activities that take a lot of effort. Ask your health care provider what activities are safe for you. General instructions  Take over-the-counter and prescription medicines only as told by your health care provider.  Talk with your health care provider about regular screenings to see if the aneurysm is getting bigger.  Keep all follow-up visits as told by your health care provider. This is important. Contact a health care provider if you have:  Unexplained weight loss. Get help right away if you have:  Pain in your upper back, neck, or abdomen. This pain may move into your chest and arms.  Trouble swallowing.  A cough or hoarseness.  Shortness of breath. Summary  A thoracic aortic aneurysm is an aneurysm that occurs in the first part of the aorta, between the heart and the diaphragm.  As a thoracic aortic aneurysm becomes larger, it can burst (rupture), or blood can flow between the layers of the wall of the aorta through a tear (aorticdissection). These conditions can be life-threatening if they are not diagnosed and treated right away.  If you have a thoracic aortic aneurysm, its growth will be closely monitored. Surgical repair may be needed for larger or faster-growing aneurysms.  This information is not intended to replace advice given to you by your health care provider. Make sure you discuss any questions you have with your health care provider. Document Revised: 01/16/2018 Document Reviewed: 01/17/2018 Elsevier Patient Education  2020 [employer]  

## 2019-07-19 NOTE — Assessment & Plan Note (Signed)
Was repaired at the same time of his thoracic aneurysm repair.  This was done surgically.  On his follow-up CT scan, we can come down on the abdomen pelvis to assess this as well as his abdominal aorta.

## 2019-07-19 NOTE — Assessment & Plan Note (Signed)
blood pressure control important in reducing the progression of atherosclerotic disease. On appropriate oral medications.  

## 2019-09-02 ENCOUNTER — Telehealth (INDEPENDENT_AMBULATORY_CARE_PROVIDER_SITE_OTHER): Payer: Self-pay | Admitting: Nurse Practitioner

## 2019-09-02 ENCOUNTER — Other Ambulatory Visit (INDEPENDENT_AMBULATORY_CARE_PROVIDER_SITE_OTHER): Payer: Self-pay | Admitting: Nurse Practitioner

## 2019-09-02 DIAGNOSIS — I724 Aneurysm of artery of lower extremity: Secondary | ICD-10-CM

## 2019-09-02 DIAGNOSIS — I712 Thoracic aortic aneurysm, without rupture, unspecified: Secondary | ICD-10-CM

## 2019-09-02 NOTE — Telephone Encounter (Signed)
I placed both orders

## 2019-11-15 ENCOUNTER — Ambulatory Visit
Admission: RE | Admit: 2019-11-15 | Discharge: 2019-11-15 | Disposition: A | Discharge status: Home / Self Care | Payer: 59 | Source: Ambulatory Visit | Attending: Nurse Practitioner | Admitting: Nurse Practitioner

## 2019-11-15 ENCOUNTER — Other Ambulatory Visit: Payer: Self-pay

## 2019-11-15 DIAGNOSIS — I712 Thoracic aortic aneurysm, without rupture, unspecified: Secondary | ICD-10-CM

## 2019-11-15 DIAGNOSIS — I724 Aneurysm of artery of lower extremity: Secondary | ICD-10-CM | POA: Insufficient documentation

## 2019-11-15 LAB — POCT I-STAT CREATININE: Creatinine, Ser: 0.9 mg/dL (ref 0.61–1.24)

## 2019-11-15 MED ORDER — IOHEXOL 350 MG/ML SOLN
100.0000 mL | Freq: Once | INTRAVENOUS | Status: AC | PRN
  Administered 2019-11-15: 100 mL via INTRAVENOUS

## 2019-11-18 ENCOUNTER — Ambulatory Visit (INDEPENDENT_AMBULATORY_CARE_PROVIDER_SITE_OTHER): Payer: Medicare Other | Admitting: Vascular Surgery

## 2019-11-18 NOTE — Progress Notes (Signed)
Please bring this patient in to review CT scan with Dr. Wyn Quaker only

## 2019-12-10 ENCOUNTER — Ambulatory Visit (INDEPENDENT_AMBULATORY_CARE_PROVIDER_SITE_OTHER): Payer: 59 | Admitting: Vascular Surgery

## 2019-12-10 ENCOUNTER — Encounter (INDEPENDENT_AMBULATORY_CARE_PROVIDER_SITE_OTHER): Payer: Self-pay | Admitting: Vascular Surgery

## 2019-12-10 ENCOUNTER — Other Ambulatory Visit: Payer: Self-pay

## 2019-12-10 VITALS — BP 126/73 | HR 67 | Resp 16 | Wt 191.6 lb

## 2019-12-10 DIAGNOSIS — I711 Thoracic aortic aneurysm, ruptured, unspecified: Secondary | ICD-10-CM

## 2019-12-10 DIAGNOSIS — I10 Essential (primary) hypertension: Secondary | ICD-10-CM | POA: Diagnosis not present

## 2019-12-10 DIAGNOSIS — I712 Thoracic aortic aneurysm, without rupture, unspecified: Secondary | ICD-10-CM

## 2019-12-10 DIAGNOSIS — E782 Mixed hyperlipidemia: Secondary | ICD-10-CM

## 2019-12-10 NOTE — Progress Notes (Signed)
MRN : [id number]  Christian Brennan is a 64 y.o. (1956-06-09) male who presents with chief complaint of  Chief Complaint  Patient presents with   Follow-up    ct results  .  History of Present Illness: Patient returns today in follow up of his ruptured thoracic aortic aneurysm.  He is now 4 years status post stent graft repair of this aneurysm.  He is doing well today and has no specific complaints.  He denies any chest pain, upper abdominal pain, or signs of peripheral embolization.  I have independently reviewed his CT scan.  His CT scan demonstrates a widely patent thoracic aortic stent graft with no evidence of endoleak.  There is no aneurysm sac surrounding the stent graft at this point and no evidence of endoleak.  Current Outpatient Medications  Medication Sig Dispense Refill   aspirin EC 81 MG tablet Take 81 mg by mouth daily.     atorvastatin (LIPITOR) 80 MG tablet Take 1 tablet (80 mg total) by mouth daily at 6 PM. 90 tablet 4   ibuprofen (ADVIL,MOTRIN) 200 MG tablet Take 400-600 mg by mouth every 4 (four) hours as needed for headache or mild pain.     metoprolol (LOPRESSOR) 50 MG tablet Take 1 tablet (50 mg total) by mouth 2 (two) times daily. 60 tablet 4   sacubitril-valsartan (ENTRESTO) 24-26 MG      lisinopril (PRINIVIL,ZESTRIL) 5 MG tablet Take 1 tablet (5 mg total) by mouth daily. 90 tablet 4   oxyCODONE-acetaminophen (PERCOCET/ROXICET) 5-325 MG tablet Take 1-2 tablets by mouth every 4 (four) hours as needed for moderate pain. (Patient not taking: Reported on 04/11/2017) 30 tablet 0   pantoprazole (PROTONIX) 40 MG tablet Take 40 mg by mouth 2 (two) times daily before a meal.     sucralfate (CARAFATE) 1 g tablet Take 1 g by mouth 4 (four) times daily -  with meals and at bedtime.     ticagrelor (BRILINTA) 90 MG TABS tablet Take 1 tablet (90 mg total) by mouth 2 (two) times daily. (Patient not taking: Reported on 04/11/2017) 60 tablet 4   No current  facility-administered medications for this visit.    Past Medical History:  Diagnosis Date   Hypertension     Past Surgical History:  Procedure Laterality Date   CARDIAC CATHETERIZATION N/A 07/10/2015   Procedure: Left Heart Cath and Coronary Angiography;  Surgeon: Lamar Blinks, MD;  Location: [facility];  Service: Cardiovascular;  Laterality: N/A;   CARDIAC CATHETERIZATION N/A 07/10/2015   Procedure: Coronary Stent Intervention;  Surgeon: Lamar Blinks, MD;  Location: [facility];  Service: Cardiovascular;  Laterality: N/A;   CARDIAC CATHETERIZATION N/A 07/10/2015   Procedure: Coronary Stent Intervention;  Surgeon: Iran Ouch, MD;  Location: [facility];  Service: Cardiovascular;  Laterality: N/A;     Social History   Tobacco Use   Smoking status: Former Smoker    Packs/day: 2.00    Types: Cigarettes    Quit date: 07/09/2013    Years since quitting: 6.4   Smokeless tobacco: Never Used   Tobacco comment: current vapor smoker  Substance Use Topics   Alcohol use: No   Drug use: No     Family History  Problem Relation Age of Onset   Stroke Mother    Aneurysm Father    Heart disease Brother     No Known Allergies   REVIEW OF SYSTEMS (Negative unless checked)  Constitutional: [] Weight  loss  [] Fever  [] Chills Cardiac: [] Chest pain   [] Chest pressure   [] Palpitations   [] Shortness of breath when laying flat   [] Shortness of breath at rest   [x] Shortness of breath with exertion. Vascular:  [] Pain in legs with walking   [] Pain in legs at rest   [] Pain in legs when laying flat   [] Claudication   [] Pain in feet when walking  [] Pain in feet at rest  [] Pain in feet when laying flat   [] History of DVT   [] Phlebitis   [] Swelling in legs   [] Varicose veins   [] Non-healing ulcers Pulmonary:   [] Uses home oxygen   [] Productive cough   [] Hemoptysis   [] Wheeze  [] COPD   [] Asthma Neurologic:  [] Dizziness  [] Blackouts   [] Seizures    [] History of stroke   [] History of TIA  [] Aphasia   [] Temporary blindness   [] Dysphagia   [] Weakness or numbness in arms   [] Weakness or numbness in legs Musculoskeletal:  [x] Arthritis   [] Joint swelling   [x] Joint pain   [] Low back pain Hematologic:  [] Easy bruising  [] Easy bleeding   [] Hypercoagulable state   [] Anemic   Gastrointestinal:  [] Blood in stool   [] Vomiting blood  [] Gastroesophageal reflux/heartburn   [] Abdominal pain Genitourinary:  [] Chronic kidney disease   [] Difficult urination  [] Frequent urination  [] Burning with urination   [] Hematuria Skin:  [] Rashes   [] Ulcers   [] Wounds Psychological:  [] History of anxiety   []  History of major depression.  Physical Examination  BP 126/73 (BP Location: Right Arm)    Pulse 67    Resp 16    Wt 191 lb 9.6 oz (86.9 kg)    BMI 33.94 kg/m  Gen:  WD/WN, NAD Head: [place]/AT, No temporalis wasting. Ear/Nose/Throat: Hearing grossly intact, nares w/o erythema or drainage Eyes: Conjunctiva clear. Sclera non-icteric Neck: Supple.  Trachea midline Pulmonary:  Good air movement, no use of accessory muscles.  Cardiac: RRR, no JVD Vascular:  Vessel Right Left  Radial Palpable Palpable               Musculoskeletal: M/S 5/5 throughout.  No deformity or atrophy. Trace LE edema. Neurologic: Sensation grossly intact in extremities.  Symmetrical.  Speech is fluent.  Psychiatric: Judgment intact, Mood & affect appropriate for pt's clinical situation. Dermatologic: No rashes or ulcers noted.  No cellulitis or open wounds.       Labs Recent Results (from the past 2160 hour(s))  I-STAT creatinine     Status: None   Collection Time: 11/15/19 10:08 AM  Result Value Ref Range   Creatinine, Ser 0.90 0.61 - 1.24 mg/dL    Radiology CT ANGIO CHEST AORTA W/CM & OR WO/CM  Result Date: 11/15/2019 CLINICAL DATA:  Thoracic aortic aneurysm. EXAM: CT ANGIOGRAPHY CHEST, ABDOMEN AND PELVIS TECHNIQUE: Non-contrast CT of the chest was initially obtained.  Multidetector CT imaging through the chest, abdomen and pelvis was performed using the standard protocol during bolus administration of intravenous contrast. Multiplanar reconstructed images and MIPs were obtained and reviewed to evaluate the vascular anatomy. CONTRAST:  100mL OMNIPAQUE IOHEXOL 350 MG/ML SOLN COMPARISON:  November 30, 2015. FINDINGS: CTA CHEST FINDINGS Cardiovascular: Status post stent graft repair of descending thoracic aorta. The stent graft is widely patent and unchanged. No dissection is noted involving the thoracic aorta. Great vessels are widely patent without significant stenosis. No definite endoleak is noted. Thoracic aorta has maximum measured diameter of 4 cm. Normal cardiac size. No pericardial effusion. Mediastinum/Nodes: No enlarged mediastinal, hilar, or  axillary lymph nodes. Thyroid gland, trachea, and esophagus demonstrate no significant findings. Lungs/Pleura: No pneumothorax or pleural effusion is noted. Emphysematous disease is noted in both lungs. No consolidative process is noted. Musculoskeletal: No chest wall abnormality. No acute or significant osseous findings. Review of the MIP images confirms the above findings. CTA ABDOMEN AND PELVIS FINDINGS VASCULAR Aorta: Atherosclerosis of abdominal aorta is noted without aneurysm or dissection. Celiac: Patent without evidence of aneurysm, dissection, vasculitis or significant stenosis. SMA: Patent without evidence of aneurysm, dissection, vasculitis or significant stenosis. Renals: Moderate stenoses are noted at the origins of both renal arteries secondary to calcified plaque. IMA: Patent without evidence of aneurysm, dissection, vasculitis or significant stenosis. 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 Left external iliac artery is widely patent. No significant stenosis is seen involving the right common or external iliac arteries. Veins: No obvious venous abnormality within the  limitations of this arterial phase study. Review of the MIP images confirms the above findings. NON-VASCULAR Hepatobiliary: No focal liver abnormality is seen. No gallstones, gallbladder wall thickening, or biliary dilatation. Pancreas: Unremarkable. No pancreatic ductal dilatation or surrounding inflammatory changes. Spleen: Normal in size without focal abnormality. Adrenals/Urinary Tract: Adrenal glands are unremarkable. Kidneys are normal, without renal calculi, focal lesion, or hydronephrosis. Bladder is unremarkable. Stomach/Bowel: The stomach appears normal. There is no evidence of bowel obstruction or inflammation. The appendix is not visualized. Mild diverticulosis is noted throughout the colon without inflammation. Lymphatic: No significant adenopathy is noted. Reproductive: Prostate is unremarkable. Other: No abdominal wall hernia or abnormality. No abdominopelvic ascites. Musculoskeletal: No acute or significant osseous findings. Review of the MIP images confirms the above findings. IMPRESSION: 1. 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 The stent graft is widely patent and unchanged. No dissection is noted involving the thoracic aorta. 2. 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 3. Moderate stenoses are noted at the origins of both renal arteries secondary to calcified plaque. 4. Mild diverticulosis is noted throughout the colon without inflammation. Aortic Atherosclerosis (ICD10-I70.0) and Emphysema (ICD10-J43.9). Electronically Signed   By: Lupita Raider M.D.   On: 11/15/2019 16:19   CT Angio Abd/Pel w/ and/or w/o  Result Date: 11/15/2019 CLINICAL DATA:  Thoracic aortic aneurysm. EXAM: CT ANGIOGRAPHY CHEST, ABDOMEN AND PELVIS TECHNIQUE: Non-contrast CT of the chest was initially obtained. Multidetector CT imaging through the chest, abdomen and pelvis was performed using the standard protocol during bolus administration of intravenous  contrast. Multiplanar reconstructed images and MIPs were obtained and reviewed to evaluate the vascular anatomy. CONTRAST:  OMNIPAQUE IOHEXOL 350 MG/ML SOLN COMPARISON:  November 30, 2015. FINDINGS: CTA CHEST FINDINGS Cardiovascular: Status post stent graft repair of descending thoracic aorta. The stent graft is widely patent and unchanged. No dissection is noted involving the thoracic aorta. Great vessels are widely patent without significant stenosis. No definite endoleak is noted. Thoracic aorta has maximum measured diameter of 4 cm. Normal cardiac size. No pericardial effusion. Mediastinum/Nodes: No enlarged mediastinal, hilar, or axillary lymph nodes. Thyroid gland, trachea, and esophagus demonstrate no significant findings. Lungs/Pleura: No pneumothorax or pleural effusion is noted. Emphysematous disease is noted in both lungs. No consolidative process is noted. Musculoskeletal: No chest wall abnormality. No acute or significant osseous findings. Review of the MIP images confirms the above findings. CTA ABDOMEN AND PELVIS FINDINGS VASCULAR Aorta: Atherosclerosis of abdominal aorta is noted without aneurysm or dissection. Celiac: Patent without  evidence of aneurysm, dissection, vasculitis or significant stenosis. SMA: Patent without evidence of aneurysm, dissection, vasculitis or significant stenosis. Renals: Moderate stenoses are noted at the origins of both renal arteries secondary to calcified plaque. IMA: Patent without evidence of aneurysm, dissection, vasculitis or significant stenosis. 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 Left external iliac artery is widely patent. No significant stenosis is seen involving the right common or external iliac arteries. Veins: No obvious venous abnormality within the limitations of this arterial phase study. Review of the MIP images confirms the above findings. NON-VASCULAR Hepatobiliary: No focal liver abnormality is  seen. No gallstones, gallbladder wall thickening, or biliary dilatation. Pancreas: Unremarkable. No pancreatic ductal dilatation or surrounding inflammatory changes. Spleen: Normal in size without focal abnormality. Adrenals/Urinary Tract: Adrenal glands are unremarkable. Kidneys are normal, without renal calculi, focal lesion, or hydronephrosis. Bladder is unremarkable. Stomach/Bowel: The stomach appears normal. There is no evidence of bowel obstruction or inflammation. The appendix is not visualized. Mild diverticulosis is noted throughout the colon without inflammation. Lymphatic: No significant adenopathy is noted. Reproductive: Prostate is unremarkable. Other: No abdominal wall hernia or abnormality. No abdominopelvic ascites. Musculoskeletal: No acute or significant osseous findings. Review of the MIP images confirms the above findings. IMPRESSION: 1. 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 The stent graft is widely patent and unchanged. No dissection is noted involving the thoracic aorta. 2. 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 3. Moderate stenoses are noted at the origins of both renal arteries secondary to calcified plaque. 4. Mild diverticulosis is noted throughout the colon without inflammation. Aortic Atherosclerosis (ICD10-I70.0) and Emphysema (ICD10-J43.9). Electronically Signed   By: Lupita Raider M.D.   On: 11/15/2019 16:19    Assessment/Plan  Mixed hyperlipidemia lipid control important in reducing the progression of atherosclerotic disease. Continue statin therapy   Benign essential HTN blood pressure control important in reducing the progression of atherosclerotic disease. On appropriate oral medications.   Ruptured thoracic aortic aneurysm (HCC) I have independently reviewed his CT scan.  His CT scan demonstrates a widely patent thoracic aortic stent graft with no evidence of endoleak.  There is no aneurysm sac  surrounding the stent graft at this point and no evidence of endoleak.  I think it would be reasonable to check this every other year at this point.  It is highly unlikely to cause a problem in the future, but the only way to be certain is to continue to monitor this and CT scan is the only reliable way to follow thoracic aortic aneurysm repairs.    Festus Barren, MD  12/10/2019 11:18 AM    This note was created with Dragon medical transcription system.  Any errors from dictation are purely unintentional

## 2019-12-10 NOTE — Assessment & Plan Note (Signed)
lipid control important in reducing the progression of atherosclerotic disease. Continue statin therapy  

## 2019-12-10 NOTE — Assessment & Plan Note (Signed)
I have independently reviewed his CT scan.  His CT scan demonstrates a widely patent thoracic aortic stent graft with no evidence of endoleak.  There is no aneurysm sac surrounding the stent graft at this point and no evidence of endoleak.  I think it would be reasonable to check this every other year at this point.  It is highly unlikely to cause a problem in the future, but the only way to be certain is to continue to monitor this and CT scan is the only reliable way to follow thoracic aortic aneurysm repairs.

## 2019-12-10 NOTE — Assessment & Plan Note (Signed)
blood pressure control important in reducing the progression of atherosclerotic disease. On appropriate oral medications.  

## 2021-07-28 ENCOUNTER — Other Ambulatory Visit
Admission: RE | Admit: 2021-07-28 | Discharge: 2021-07-28 | Disposition: A | Discharge status: Home / Self Care | Payer: Medicare HMO | Source: Ambulatory Visit | Attending: Internal Medicine | Admitting: Internal Medicine

## 2021-07-28 DIAGNOSIS — Z01818 Encounter for other preprocedural examination: Secondary | ICD-10-CM | POA: Diagnosis present

## 2021-07-28 DIAGNOSIS — I5022 Chronic systolic (congestive) heart failure: Secondary | ICD-10-CM | POA: Diagnosis not present

## 2021-07-28 LAB — BRAIN NATRIURETIC PEPTIDE: B Natriuretic Peptide: 31.5 pg/mL (ref 0.0–100.0)

## 2021-08-11 ENCOUNTER — Other Ambulatory Visit: Payer: Self-pay

## 2021-08-11 ENCOUNTER — Encounter: Payer: Self-pay | Admitting: Internal Medicine

## 2021-08-11 ENCOUNTER — Ambulatory Visit
Admission: RE | Admit: 2021-08-11 | Discharge: 2021-08-11 | Disposition: A | Discharge status: Home / Self Care | Payer: Medicare HMO | Attending: Internal Medicine | Admitting: Internal Medicine

## 2021-08-11 ENCOUNTER — Encounter
Admission: RE | Disposition: A | Discharge status: Home / Self Care | Payer: Self-pay | Source: Home / Self Care | Attending: Internal Medicine

## 2021-08-11 DIAGNOSIS — E785 Hyperlipidemia, unspecified: Secondary | ICD-10-CM | POA: Diagnosis not present

## 2021-08-11 DIAGNOSIS — I509 Heart failure, unspecified: Secondary | ICD-10-CM | POA: Insufficient documentation

## 2021-08-11 DIAGNOSIS — I200 Unstable angina: Secondary | ICD-10-CM

## 2021-08-11 DIAGNOSIS — Z955 Presence of coronary angioplasty implant and graft: Secondary | ICD-10-CM | POA: Insufficient documentation

## 2021-08-11 DIAGNOSIS — I110 Hypertensive heart disease with heart failure: Secondary | ICD-10-CM | POA: Insufficient documentation

## 2021-08-11 DIAGNOSIS — I429 Cardiomyopathy, unspecified: Secondary | ICD-10-CM | POA: Insufficient documentation

## 2021-08-11 DIAGNOSIS — I25110 Atherosclerotic heart disease of native coronary artery with unstable angina pectoris: Secondary | ICD-10-CM | POA: Insufficient documentation

## 2021-08-11 DIAGNOSIS — I209 Angina pectoris, unspecified: Secondary | ICD-10-CM | POA: Diagnosis present

## 2021-08-11 HISTORY — PX: LEFT HEART CATH AND CORONARY ANGIOGRAPHY: CATH118249

## 2021-08-11 SURGERY — LEFT HEART CATH AND CORONARY ANGIOGRAPHY
Anesthesia: Moderate Sedation

## 2021-08-11 MED ORDER — FENTANYL CITRATE (PF) 100 MCG/2ML IJ SOLN
INTRAMUSCULAR | Status: AC
  Filled 2021-08-11: qty 2

## 2021-08-11 MED ORDER — VERAPAMIL HCL 2.5 MG/ML IV SOLN
INTRAVENOUS | Status: AC
  Filled 2021-08-11: qty 2

## 2021-08-11 MED ORDER — SODIUM CHLORIDE 0.9 % WEIGHT BASED INFUSION
1.0000 mL/kg/h | INTRAVENOUS | Status: DC
  Administered 2021-08-11: 1 mL/kg/h via INTRAVENOUS

## 2021-08-11 MED ORDER — ASPIRIN 81 MG PO CHEW: 81.0000 mg | CHEWABLE_TABLET | ORAL | Status: DC

## 2021-08-11 MED ORDER — HEPARIN (PORCINE) IN NACL 1000-0.9 UT/500ML-% IV SOLN
INTRAVENOUS | Status: DC | PRN
  Administered 2021-08-11 (×2): 500 mL

## 2021-08-11 MED ORDER — SODIUM CHLORIDE 0.9 % WEIGHT BASED INFUSION
3.0000 mL/kg/h | INTRAVENOUS | Status: AC
  Administered 2021-08-11: 3 mL/kg/h via INTRAVENOUS

## 2021-08-11 MED ORDER — LIDOCAINE HCL (PF) 1 % IJ SOLN
INTRAMUSCULAR | Status: DC | PRN
  Administered 2021-08-11: 2 mL

## 2021-08-11 MED ORDER — ONDANSETRON HCL 4 MG/2ML IJ SOLN: 4.0000 mg | Freq: Four times a day (QID) | INTRAMUSCULAR | Status: DC | PRN

## 2021-08-11 MED ORDER — HEPARIN (PORCINE) IN NACL 1000-0.9 UT/500ML-% IV SOLN
INTRAVENOUS | Status: AC
  Filled 2021-08-11: qty 1000

## 2021-08-11 MED ORDER — SODIUM CHLORIDE 0.9% FLUSH: 3.0000 mL | Freq: Two times a day (BID) | INTRAVENOUS | Status: DC

## 2021-08-11 MED ORDER — MIDAZOLAM HCL 2 MG/2ML IJ SOLN
INTRAMUSCULAR | Status: DC | PRN
Start: 2021-08-11 — End: 2021-08-11
  Administered 2021-08-11: 1 mg via INTRAVENOUS

## 2021-08-11 MED ORDER — LABETALOL HCL 5 MG/ML IV SOLN: 10.0000 mg | INTRAVENOUS | Status: DC | PRN

## 2021-08-11 MED ORDER — SODIUM CHLORIDE 0.9% FLUSH
3.0000 mL | INTRAVENOUS | Status: DC | PRN
Start: 2021-08-11 — End: 2021-08-11

## 2021-08-11 MED ORDER — HEPARIN SODIUM (PORCINE) 1000 UNIT/ML IJ SOLN
INTRAMUSCULAR | Status: AC
Start: 2021-08-11 — End: ?
  Filled 2021-08-11: qty 10

## 2021-08-11 MED ORDER — FENTANYL CITRATE (PF) 100 MCG/2ML IJ SOLN
INTRAMUSCULAR | Status: DC | PRN
  Administered 2021-08-11: 25 ug via INTRAVENOUS

## 2021-08-11 MED ORDER — ACETAMINOPHEN 325 MG PO TABS: 650.0000 mg | ORAL_TABLET | ORAL | Status: DC | PRN

## 2021-08-11 MED ORDER — HEPARIN SODIUM (PORCINE) 1000 UNIT/ML IJ SOLN
INTRAMUSCULAR | Status: DC | PRN
  Administered 2021-08-11: 4000 [IU] via INTRAVENOUS

## 2021-08-11 MED ORDER — LIDOCAINE HCL 1 % IJ SOLN
INTRAMUSCULAR | Status: AC
  Filled 2021-08-11: qty 20

## 2021-08-11 MED ORDER — MIDAZOLAM HCL 2 MG/2ML IJ SOLN
INTRAMUSCULAR | Status: AC
  Filled 2021-08-11: qty 2

## 2021-08-11 MED ORDER — SODIUM CHLORIDE 0.9 % IV SOLN: 250.0000 mL | INTRAVENOUS | Status: DC | PRN

## 2021-08-11 MED ORDER — IOHEXOL 300 MG/ML  SOLN
INTRAMUSCULAR | Status: DC | PRN
  Administered 2021-08-11: 60 mL

## 2021-08-11 MED ORDER — HYDRALAZINE HCL 20 MG/ML IJ SOLN: 10.0000 mg | INTRAMUSCULAR | Status: DC | PRN

## 2021-08-11 MED ORDER — VERAPAMIL HCL 2.5 MG/ML IV SOLN
INTRAVENOUS | Status: DC | PRN
  Administered 2021-08-11: 2.5 mg via INTRA_ARTERIAL

## 2021-08-11 SURGICAL SUPPLY — 10 items
CATH 5FR JL3.5 JR4 ANG PIG MP (CATHETERS) ×1 IMPLANT
DEVICE RAD TR BAND REGULAR (VASCULAR PRODUCTS) ×1 IMPLANT
DRAPE BRACHIAL (DRAPES) ×1 IMPLANT
GUIDEWIRE INQWIRE 1.5J.035X260 (WIRE) IMPLANT
INQWIRE 1.5J .035X260CM (WIRE) ×2
KIT SYRINGE INJ CVI SPIKEX1 (MISCELLANEOUS) ×1 IMPLANT
PACK CARDIAC CATH (CUSTOM PROCEDURE TRAY) ×2 IMPLANT
PROTECTION STATION PRESSURIZED (MISCELLANEOUS) ×2
SET ATX SIMPLICITY (MISCELLANEOUS) ×1 IMPLANT
STATION PROTECTION PRESSURIZED (MISCELLANEOUS) IMPLANT

## 2021-08-12 ENCOUNTER — Encounter: Payer: Self-pay | Admitting: Internal Medicine

## 2022-02-26 IMAGING — CT CT CTA ABD/PEL W/CM AND/OR W/O CM
2 of 10 series · 11 of 46 positions shown, 15 images · IV contrast (APPLIED)
Comparison: November 30, 2015.

CLINICAL DATA: Thoracic aortic aneurysm.

EXAM:
CT ANGIOGRAPHY CHEST, ABDOMEN AND PELVIS
TECHNIQUE: Non-contrast CT of the chest was initially obtained.

[Series 5: axial arterial · axial · arterial · 0.78mm/px · z∈[-1100,-602]mm · 9 of 204 slices shown, 13 images]
[im 19/204  soft-tissue]
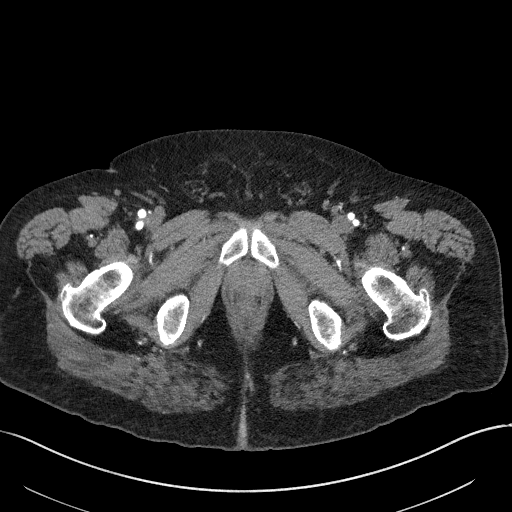
[im 19/204  bone]
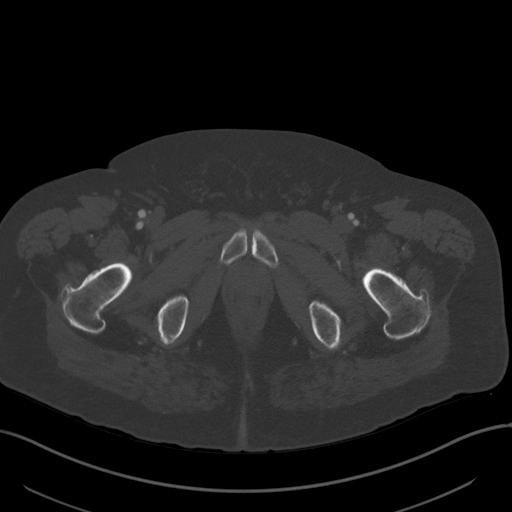
[im 37/204  soft-tissue]
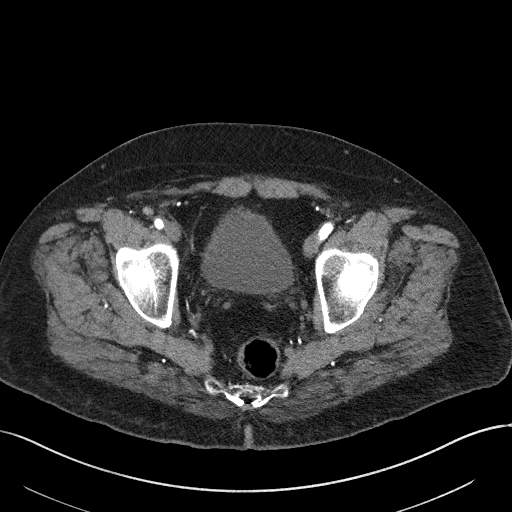
[im 74/204  soft-tissue]
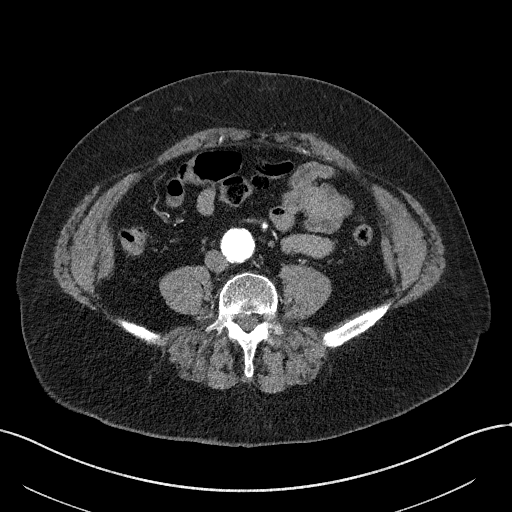
[im 93/204  soft-tissue]
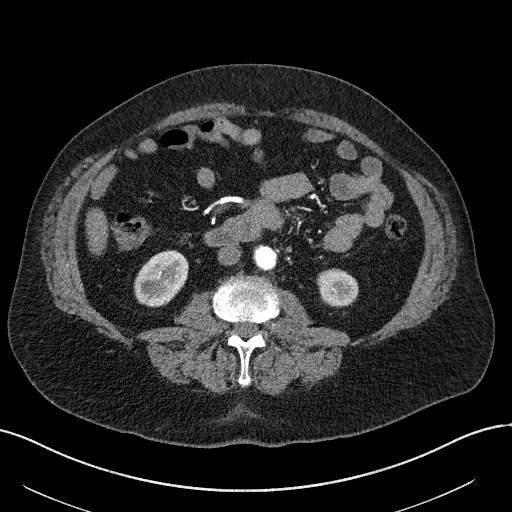
[im 111/204  soft-tissue]
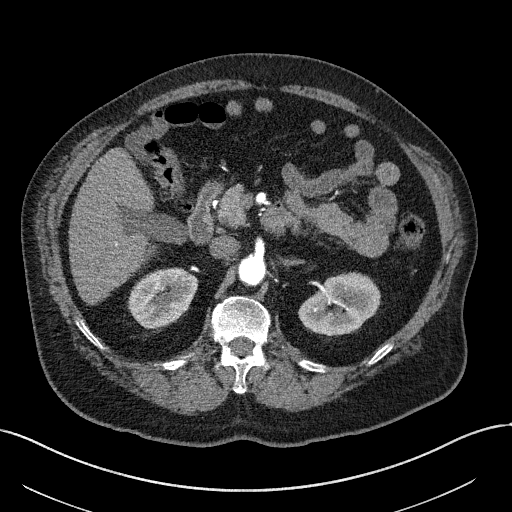
[im 130/204  soft-tissue]
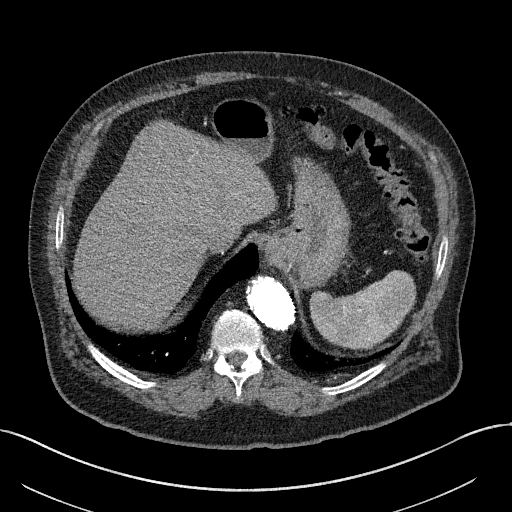
[im 130/204  lung]
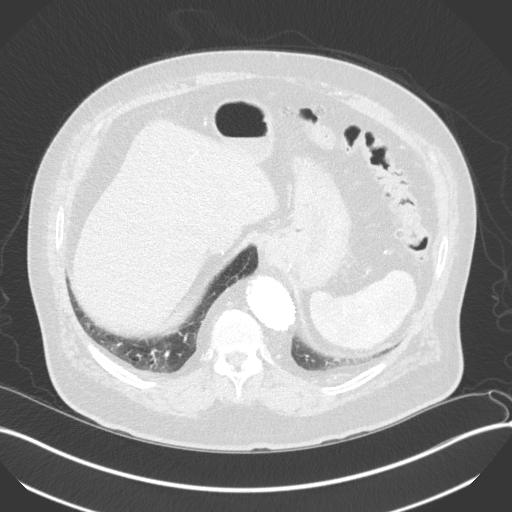
[im 148/204  lung]
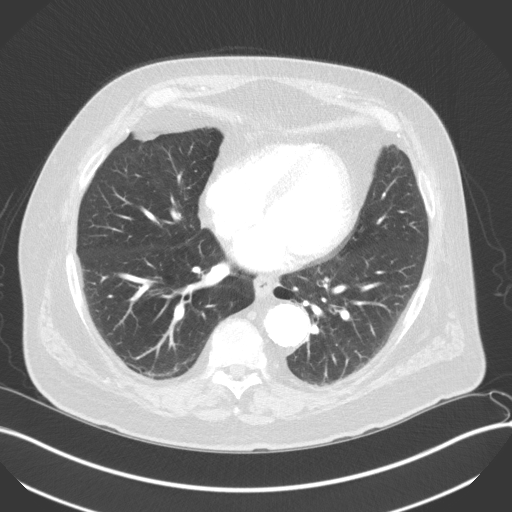
[im 167/204  soft-tissue]
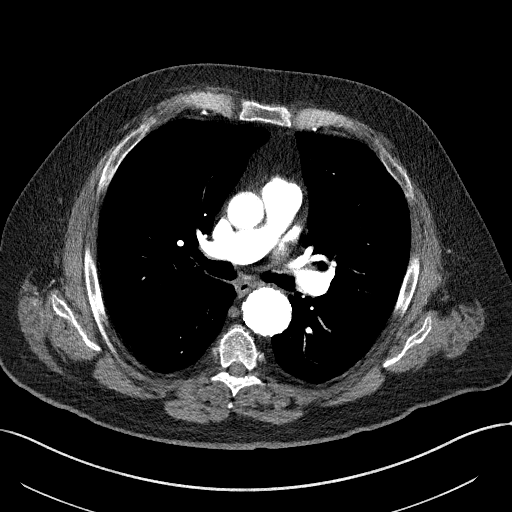
[im 167/204  lung]
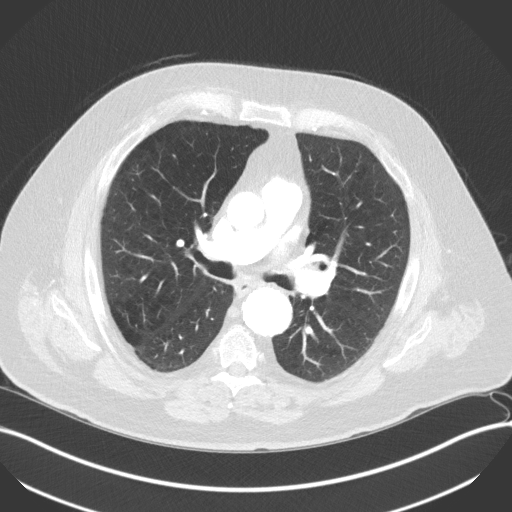
[im 185/204  soft-tissue]
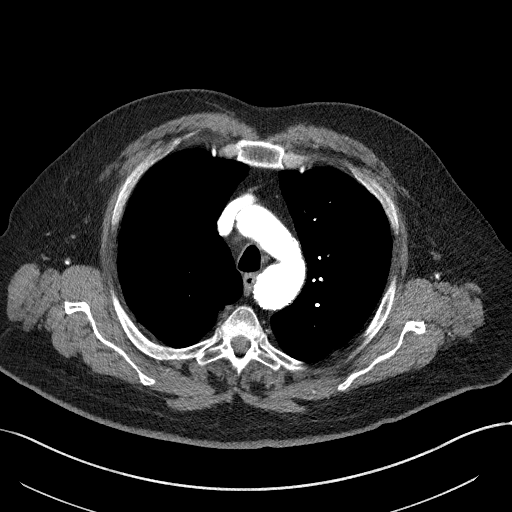
[im 185/204  lung]
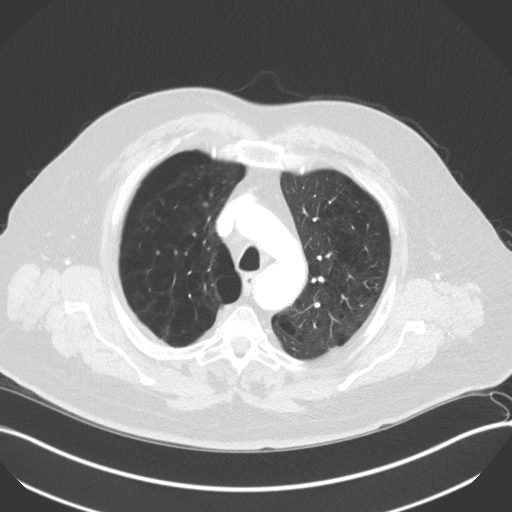

[Series 8: coronals · coronal · 0.79mm/px · 2 of 141 slices shown]
[im 47/141  soft-tissue]
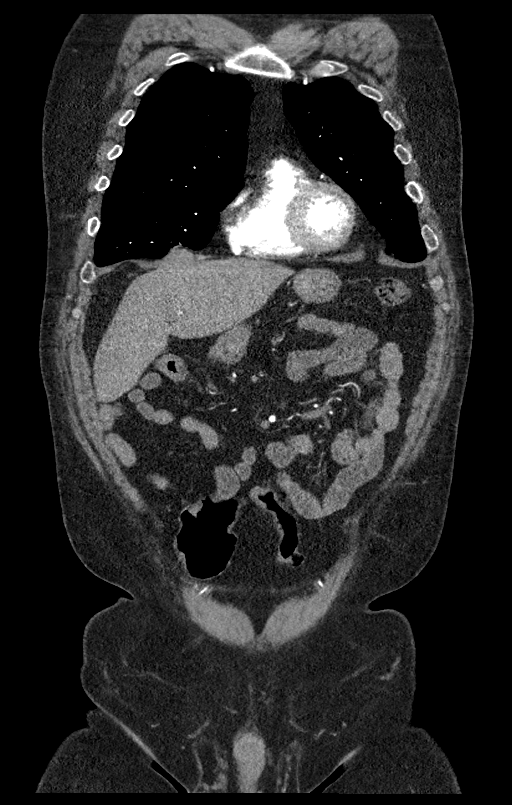
[im 94/141  soft-tissue]
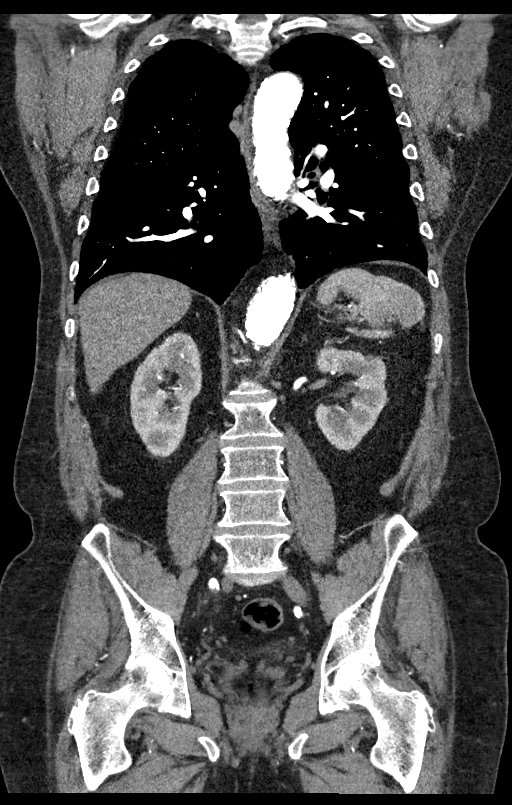

[11 of 46 positions shown; findings below may reference images not displayed]

Multidetector CT imaging through the chest, abdomen and pelvis was
performed using the standard protocol during bolus administration of
intravenous contrast. Multiplanar reconstructed images and MIPs were
obtained and reviewed to evaluate the vascular anatomy.

CONTRAST:  100mL OMNIPAQUE IOHEXOL 350 MG/ML SOLN
FINDINGS: CTA CHEST FINDINGS

Cardiovascular: Status post stent graft repair of descending
thoracic aorta. The stent graft is widely patent and unchanged. No
dissection is noted involving the thoracic aorta. Great vessels are
widely patent without significant stenosis. No definite endoleak is
noted. Thoracic aorta has maximum measured diameter of 4 cm. Normal
cardiac size. No pericardial effusion.

Mediastinum/Nodes: No enlarged mediastinal, hilar, or axillary lymph
nodes. Thyroid gland, trachea, and esophagus demonstrate no
significant findings.

Lungs/Pleura: No pneumothorax or pleural effusion is noted.
Emphysematous disease is noted in both lungs. No consolidative
process is noted.

Musculoskeletal: No chest wall abnormality. No acute or significant
osseous findings.

Review of the MIP images confirms the above findings.

CTA ABDOMEN AND PELVIS FINDINGS

VASCULAR

Aorta: Atherosclerosis of abdominal aorta is noted without aneurysm
or dissection.

Celiac: Patent without evidence of aneurysm, dissection, vasculitis
or significant stenosis.

SMA: Patent without evidence of aneurysm, dissection, vasculitis or
significant stenosis.

Renals: Moderate stenoses are noted at the origins of both renal
arteries secondary to calcified plaque.

IMA: Patent without evidence of aneurysm, dissection, vasculitis or
significant stenosis.

Inflow: Moderate focal stenosis is noted at the origin of the left
common iliac artery. Left external iliac artery is widely patent. No
significant stenosis is seen involving the right common or external
iliac arteries.

Veins: No obvious venous abnormality within the limitations of this
arterial phase study.

Review of the MIP images confirms the above findings.

NON-VASCULAR

Hepatobiliary: No focal liver abnormality is seen. No gallstones,
gallbladder wall thickening, or biliary dilatation.

Pancreas: Unremarkable. No pancreatic ductal dilatation or
surrounding inflammatory changes.

Spleen: Normal in size without focal abnormality.

Adrenals/Urinary Tract: Adrenal glands are unremarkable. Kidneys are
normal, without renal calculi, focal lesion, or hydronephrosis.
Bladder is unremarkable.

Stomach/Bowel: The stomach appears normal. There is no evidence of
bowel obstruction or inflammation. The appendix is not visualized.
Mild diverticulosis is noted throughout the colon without
inflammation.

Lymphatic: No significant adenopathy is noted.

Reproductive: Prostate is unremarkable.

Other: No abdominal wall hernia or abnormality. No abdominopelvic
ascites.

Musculoskeletal: No acute or significant osseous findings.

Review of the MIP images confirms the above findings.
IMPRESSION: 1. Status post stent graft repair of descending thoracic aorta. The
stent graft is widely patent and unchanged. No dissection is noted
involving the thoracic aorta.
2. Moderate focal stenosis is noted at the origin of the left common
iliac artery.
3. Moderate stenoses are noted at the origins of both renal arteries
secondary to calcified plaque.
4. Mild diverticulosis is noted throughout the colon without
inflammation.

Aortic Atherosclerosis (8E7T5-SY0.0) and Emphysema (8E7T5-GWY.P).

## 2022-04-11 ENCOUNTER — Encounter (INDEPENDENT_AMBULATORY_CARE_PROVIDER_SITE_OTHER): Payer: Self-pay

## 2024-07-03 ENCOUNTER — Encounter: Admission: RE | Disposition: A | Discharge status: Home / Self Care | Payer: Self-pay | Source: Home / Self Care

## 2024-07-03 ENCOUNTER — Ambulatory Visit: Admission: RE | Admit: 2024-07-03 | Discharge: 2024-07-03 | Disposition: A | Discharge status: Home / Self Care

## 2024-07-03 DIAGNOSIS — R9430 Abnormal result of cardiovascular function study, unspecified: Secondary | ICD-10-CM

## 2024-07-03 DIAGNOSIS — Z955 Presence of coronary angioplasty implant and graft: Secondary | ICD-10-CM | POA: Diagnosis not present

## 2024-07-03 DIAGNOSIS — I2510 Atherosclerotic heart disease of native coronary artery without angina pectoris: Secondary | ICD-10-CM | POA: Diagnosis not present

## 2024-07-03 DIAGNOSIS — Z7982 Long term (current) use of aspirin: Secondary | ICD-10-CM | POA: Diagnosis not present

## 2024-07-03 DIAGNOSIS — I10 Essential (primary) hypertension: Secondary | ICD-10-CM | POA: Diagnosis not present

## 2024-07-03 DIAGNOSIS — E782 Mixed hyperlipidemia: Secondary | ICD-10-CM | POA: Diagnosis not present

## 2024-07-03 DIAGNOSIS — Q245 Malformation of coronary vessels: Secondary | ICD-10-CM | POA: Diagnosis not present

## 2024-07-03 DIAGNOSIS — Z79899 Other long term (current) drug therapy: Secondary | ICD-10-CM | POA: Diagnosis not present

## 2024-07-03 HISTORY — PX: LEFT HEART CATH AND CORONARY ANGIOGRAPHY: CATH118249

## 2024-07-03 MED ORDER — HYDRALAZINE HCL 20 MG/ML IJ SOLN: 10.0000 mg | INTRAMUSCULAR | Status: DC | PRN

## 2024-07-03 MED ORDER — SODIUM CHLORIDE 0.9% FLUSH: 3.0000 mL | INTRAVENOUS | Status: DC | PRN

## 2024-07-03 MED ORDER — ACETAMINOPHEN 325 MG PO TABS: 650.0000 mg | ORAL_TABLET | ORAL | Status: DC | PRN

## 2024-07-03 MED ORDER — FREE WATER: 250.0000 mL | Freq: Once | Status: DC

## 2024-07-03 MED ORDER — SODIUM CHLORIDE 0.9 % IV SOLN: 250.0000 mL | INTRAVENOUS | Status: DC | PRN

## 2024-07-03 MED ORDER — SODIUM CHLORIDE 0.9% FLUSH: 3.0000 mL | Freq: Two times a day (BID) | INTRAVENOUS | Status: DC

## 2024-07-03 MED ORDER — HEPARIN SODIUM (PORCINE) 1000 UNIT/ML IJ SOLN
INTRAMUSCULAR | Status: AC
  Filled 2024-07-03: qty 10

## 2024-07-03 MED ORDER — ONDANSETRON HCL 4 MG/2ML IJ SOLN: 4.0000 mg | Freq: Four times a day (QID) | INTRAMUSCULAR | Status: DC | PRN

## 2024-07-03 MED ORDER — FENTANYL CITRATE (PF) 100 MCG/2ML IJ SOLN
INTRAMUSCULAR | Status: DC | PRN
  Administered 2024-07-03: 50 ug via INTRAVENOUS

## 2024-07-03 MED ORDER — MIDAZOLAM HCL (PF) 2 MG/2ML IJ SOLN
INTRAMUSCULAR | Status: DC | PRN
  Administered 2024-07-03: 1 mg via INTRAVENOUS

## 2024-07-03 MED ORDER — MIDAZOLAM HCL 2 MG/2ML IJ SOLN
INTRAMUSCULAR | Status: AC
  Filled 2024-07-03: qty 2

## 2024-07-03 MED ORDER — VERAPAMIL HCL 2.5 MG/ML IV SOLN
INTRAVENOUS | Status: DC | PRN
  Administered 2024-07-03: 2.5 mg via INTRAVENOUS

## 2024-07-03 MED ORDER — HEPARIN (PORCINE) IN NACL 2000-0.9 UNIT/L-% IV SOLN
INTRAVENOUS | Status: DC | PRN
  Administered 2024-07-03: 1000 mL

## 2024-07-03 MED ORDER — FENTANYL CITRATE (PF) 100 MCG/2ML IJ SOLN
INTRAMUSCULAR | Status: AC
  Filled 2024-07-03: qty 2

## 2024-07-03 MED ORDER — HEPARIN SODIUM (PORCINE) 1000 UNIT/ML IJ SOLN
INTRAMUSCULAR | Status: DC | PRN
  Administered 2024-07-03: 5000 [IU] via INTRAVENOUS

## 2024-07-03 MED ORDER — FREE WATER: 500.0000 mL | Freq: Once | Status: DC

## 2024-07-03 MED ORDER — LIDOCAINE HCL (PF) 1 % IJ SOLN
INTRAMUSCULAR | Status: DC | PRN
  Administered 2024-07-03: 2 mL

## 2024-07-03 MED ORDER — LIDOCAINE HCL 1 % IJ SOLN
INTRAMUSCULAR | Status: AC
  Filled 2024-07-03: qty 20

## 2024-07-03 MED ORDER — IOHEXOL 300 MG/ML  SOLN
INTRAMUSCULAR | Status: DC | PRN
  Administered 2024-07-03: 38 mL

## 2024-07-03 MED ORDER — SODIUM CHLORIDE 0.9 % IV SOLN
250.0000 mL | INTRAVENOUS | Status: DC | PRN
  Administered 2024-07-03: 250 mL via INTRAVENOUS

## 2024-07-03 MED ORDER — VERAPAMIL HCL 2.5 MG/ML IV SOLN
INTRAVENOUS | Status: AC
  Filled 2024-07-03: qty 2

## 2024-07-03 MED ORDER — ASPIRIN 81 MG PO CHEW: 81.0000 mg | CHEWABLE_TABLET | ORAL | Status: DC
